# Patient Record
Sex: Female | Born: 1980 | Race: White | Hispanic: No | Smoking: Former smoker
Health system: Southern US, Community
[De-identification: ages and names within clinical notes are randomized; demographics above are authoritative.]

## PROBLEM LIST (undated history)

## (undated) DIAGNOSIS — G43909 Migraine, unspecified, not intractable, without status migrainosus: Secondary | ICD-10-CM

## (undated) DIAGNOSIS — K219 Gastro-esophageal reflux disease without esophagitis: Secondary | ICD-10-CM

## (undated) HISTORY — DX: Gastro-esophageal reflux disease without esophagitis: K21.9

## (undated) HISTORY — PX: TUBAL LIGATION: SHX77

## (undated) HISTORY — PX: CYST EXCISION: SHX5701

---

## 2004-03-03 ENCOUNTER — Emergency Department (HOSPITAL_COMMUNITY): Admission: EM | Admit: 2004-03-03 | Discharge: 2004-03-03 | Payer: Self-pay | Admitting: Emergency Medicine

## 2004-12-02 ENCOUNTER — Emergency Department (HOSPITAL_COMMUNITY): Admission: EM | Admit: 2004-12-02 | Discharge: 2004-12-03 | Payer: Self-pay | Admitting: Emergency Medicine

## 2005-03-28 ENCOUNTER — Emergency Department (HOSPITAL_COMMUNITY): Admission: EM | Admit: 2005-03-28 | Discharge: 2005-03-28 | Payer: Self-pay | Admitting: Family Medicine

## 2005-03-31 ENCOUNTER — Emergency Department (HOSPITAL_COMMUNITY): Admission: EM | Admit: 2005-03-31 | Discharge: 2005-04-01 | Payer: Self-pay | Admitting: Emergency Medicine

## 2006-01-27 ENCOUNTER — Emergency Department (HOSPITAL_COMMUNITY): Admission: EM | Admit: 2006-01-27 | Discharge: 2006-01-27 | Payer: Self-pay | Admitting: Emergency Medicine

## 2006-02-20 ENCOUNTER — Emergency Department (HOSPITAL_COMMUNITY): Admission: EM | Admit: 2006-02-20 | Discharge: 2006-02-20 | Payer: Self-pay | Admitting: Emergency Medicine

## 2006-03-31 ENCOUNTER — Encounter (INDEPENDENT_AMBULATORY_CARE_PROVIDER_SITE_OTHER): Payer: Self-pay | Admitting: Specialist

## 2006-03-31 ENCOUNTER — Other Ambulatory Visit: Admission: RE | Admit: 2006-03-31 | Discharge: 2006-03-31 | Payer: Self-pay | Admitting: Unknown Physician Specialty

## 2006-04-01 ENCOUNTER — Emergency Department (HOSPITAL_COMMUNITY): Admission: EM | Admit: 2006-04-01 | Discharge: 2006-04-01 | Payer: Self-pay | Admitting: Emergency Medicine

## 2006-04-01 ENCOUNTER — Emergency Department (HOSPITAL_COMMUNITY): Admission: EM | Admit: 2006-04-01 | Discharge: 2006-04-02 | Payer: Self-pay | Admitting: Emergency Medicine

## 2007-06-07 ENCOUNTER — Emergency Department (HOSPITAL_COMMUNITY): Admission: EM | Admit: 2007-06-07 | Discharge: 2007-06-07 | Payer: Self-pay | Admitting: Emergency Medicine

## 2007-06-29 ENCOUNTER — Emergency Department (HOSPITAL_COMMUNITY): Admission: EM | Admit: 2007-06-29 | Discharge: 2007-06-29 | Payer: Self-pay | Admitting: Emergency Medicine

## 2007-07-29 ENCOUNTER — Emergency Department (HOSPITAL_COMMUNITY): Admission: EM | Admit: 2007-07-29 | Discharge: 2007-07-29 | Payer: Self-pay | Admitting: Emergency Medicine

## 2007-08-16 ENCOUNTER — Emergency Department (HOSPITAL_COMMUNITY): Admission: EM | Admit: 2007-08-16 | Discharge: 2007-08-16 | Payer: Self-pay | Admitting: Emergency Medicine

## 2007-11-03 ENCOUNTER — Other Ambulatory Visit: Admission: RE | Admit: 2007-11-03 | Discharge: 2007-11-03 | Payer: Self-pay | Admitting: Obstetrics and Gynecology

## 2008-04-16 ENCOUNTER — Observation Stay (HOSPITAL_COMMUNITY): Admission: AD | Admit: 2008-04-16 | Discharge: 2008-04-16 | Payer: Self-pay | Admitting: Obstetrics & Gynecology

## 2008-04-16 ENCOUNTER — Ambulatory Visit: Payer: Self-pay | Admitting: Obstetrics and Gynecology

## 2008-04-18 ENCOUNTER — Inpatient Hospital Stay (HOSPITAL_COMMUNITY): Admission: AD | Admit: 2008-04-18 | Discharge: 2008-04-18 | Payer: Self-pay | Admitting: Obstetrics & Gynecology

## 2008-04-18 ENCOUNTER — Ambulatory Visit: Payer: Self-pay | Admitting: Advanced Practice Midwife

## 2008-05-06 ENCOUNTER — Inpatient Hospital Stay (HOSPITAL_COMMUNITY): Admission: AD | Admit: 2008-05-06 | Discharge: 2008-05-06 | Payer: Self-pay | Admitting: Obstetrics & Gynecology

## 2008-05-11 ENCOUNTER — Ambulatory Visit: Payer: Self-pay | Admitting: Family Medicine

## 2008-05-11 ENCOUNTER — Inpatient Hospital Stay (HOSPITAL_COMMUNITY): Admission: AD | Admit: 2008-05-11 | Discharge: 2008-05-13 | Payer: Self-pay | Admitting: Obstetrics & Gynecology

## 2008-08-29 ENCOUNTER — Emergency Department (HOSPITAL_COMMUNITY): Admission: EM | Admit: 2008-08-29 | Discharge: 2008-08-29 | Payer: Self-pay | Admitting: Emergency Medicine

## 2008-08-30 ENCOUNTER — Emergency Department (HOSPITAL_COMMUNITY): Admission: EM | Admit: 2008-08-30 | Discharge: 2008-08-30 | Payer: Self-pay | Admitting: Emergency Medicine

## 2009-03-26 ENCOUNTER — Emergency Department (HOSPITAL_COMMUNITY): Admission: EM | Admit: 2009-03-26 | Discharge: 2009-03-26 | Payer: Self-pay | Admitting: Emergency Medicine

## 2009-04-12 ENCOUNTER — Emergency Department (HOSPITAL_COMMUNITY): Admission: EM | Admit: 2009-04-12 | Discharge: 2009-04-12 | Payer: Self-pay | Admitting: Emergency Medicine

## 2009-09-22 ENCOUNTER — Emergency Department (HOSPITAL_COMMUNITY): Admission: EM | Admit: 2009-09-22 | Discharge: 2009-09-22 | Payer: Self-pay | Admitting: Emergency Medicine

## 2009-12-17 ENCOUNTER — Other Ambulatory Visit: Admission: RE | Admit: 2009-12-17 | Discharge: 2009-12-17 | Payer: Self-pay | Admitting: Obstetrics and Gynecology

## 2010-02-22 ENCOUNTER — Emergency Department (HOSPITAL_COMMUNITY): Admission: EM | Admit: 2010-02-22 | Discharge: 2010-02-22 | Payer: Self-pay | Admitting: Emergency Medicine

## 2010-04-29 ENCOUNTER — Inpatient Hospital Stay (HOSPITAL_COMMUNITY)
Admission: AD | Admit: 2010-04-29 | Discharge: 2010-05-02 | Payer: Self-pay | Source: Home / Self Care | Admitting: Obstetrics & Gynecology

## 2010-06-24 NOTE — Op Note (Addendum)
Kirsten Myers, Kirsten Myers           ACCOUNT NO.:  [id number]  MEDICAL RECORD NO.:  [id number]          PATIENT TYPE:  INP  LOCATION:  9371                          FACILITY:  [facility]  PHYSICIAN:  Lucina Mellow, DO   DATE OF BIRTH:  05/01/1981  DATE OF PROCEDURE:  04/30/2010 DATE OF DISCHARGE:                              OPERATIVE REPORT   PREOPERATIVE DIAGNOSIS:  Undesired fertility status post normal spontaneous vaginal delivery of a gravida 6, para 4-0-2-4.  POSTOPERATIVE DIAGNOSIS:  Undesired fertility status post normal spontaneous vaginal delivery of a gravida 6, para 4-0-2-4.  SURGEON:  Dr. Debroah Loop.  ASSISTANT:  Lucina Mellow, DO  ANESTHESIA:  Epidural.  PROCEDURE:  Bilateral tubal ligation with Filshie clips.  COMPLICATIONS:  None.  ESTIMATED BLOOD LOSS:  Less than 20 mL.  INDICATION:  A 30 year old female gravida 6, para 4-0-2-4 who was induced for preeclampsia and subsequently had a normal vaginal delivery of a viable infant female.  The patient desired permanent sterilization. Risks and benefits of the procedure were discussed with the patient including the risk of failure and increased risk of ectopic gestation and pregnancy were to occur.  FINDINGS:  Included a normal uterus, tubes, and ovaries.  PROCEDURE IN DETAIL:  The patient was taken to the operating room, where her epidural was found to be adequate.  A small transverse infraumbilical skin incision was then made with the scalpel after 5 mL of 0.5% Marcaine were injected subcutaneously.  Incision was carried through the underlying fascia into the peritoneum, was identified, and entered.  The peritoneum was noted to be free of any adhesions and the incision was then extended with the Metzenbaum scissors.  The patient's left fallopian tube was identified, brought to the incision, and grasped with a Babcock clamp.  The tube was followed up to the fimbria.  The Babcock clamp was then used to grasp the  tube approximately two-thirds right from the corneal region.  A Filshie clip was applied in normal fashion.  The right fallopian tube was then identified in the same fashion, brought to the incision, grasped with the Babcock clamp, followed up with the fimbriae and then was back to about two-third away from the corneal region.  Again, a Filshie clip was placed in the normal fashion.  Excellent hemostasis was noted and then tubes were both returned to the abdomen.  The peritoneum and fascia were closed with single layer of 0 Vicryl.  The skin was closed with the subcuticular fashion using a 2-0 Vicryl.  Three interrupted 4-0 Vicryl incisions were made to approximately to skin.  Sponge, lap, and needle counts were correct x2.  The patient tolerated the procedure well and the patient was taken to the recovery room in stable condition.  Medications given during the procedure included the continued magnesium sulfate at 2 g an hour at which she will also continue postpartum in recovery room and then finally whenever she is transferred to postpartum floor for 24 total hours.  COMPLICATIONS OF THE PROCEDURE:  None.          ______________________________ Lucina Mellow, DO     SH/MEDQ  D:  04/30/2010  T:  04/30/2010  Job:  [id number]  Electronically Signed by Lucina Mellow MD on 05/03/2010 08:55:06 AM Electronically Signed by Scheryl Darter MD on 06/24/2010 11:42:24 AM

## 2010-08-13 LAB — RPR: RPR Ser Ql: NONREACTIVE

## 2010-08-13 LAB — COMPREHENSIVE METABOLIC PANEL
ALT: 10 U/L (ref 0–35)
AST: 19 U/L (ref 0–37)
Albumin: 2.6 g/dL — ABNORMAL LOW (ref 3.5–5.2)
Calcium: 9.4 mg/dL (ref 8.4–10.5)
Creatinine, Ser: 0.73 mg/dL (ref 0.4–1.2)
GFR calc Af Amer: >60 mL/min (ref >60)
Sodium: 135 mEq/L (ref 135–145)
Total Protein: 5.5 g/dL — ABNORMAL LOW (ref 6.0–8.3)

## 2010-08-13 LAB — GLUCOSE, CAPILLARY
Glucose-Capillary: 140 mg/dL — ABNORMAL HIGH (ref 70–99)
Glucose-Capillary: 79 mg/dL (ref 70–99)
Glucose-Capillary: 93 mg/dL (ref 70–99)
Glucose-Capillary: 94 mg/dL (ref 70–99)
Glucose-Capillary: 96 mg/dL (ref 70–99)

## 2010-08-13 LAB — CBC
HCT: 35.1 % — ABNORMAL LOW (ref 36.0–46.0)
HCT: 36.1 % (ref 36.0–46.0)
Hemoglobin: 11.9 g/dL — ABNORMAL LOW (ref 12.0–15.0)
Platelets: 190 10*3/uL (ref 150–400)
RDW: 14.4 % (ref 11.5–15.5)
RDW: 14.8 % (ref 11.5–15.5)
WBC: 10.4 10*3/uL (ref 4.0–10.5)
WBC: 12.7 10*3/uL — ABNORMAL HIGH (ref 4.0–10.5)

## 2010-08-13 LAB — MRSA PCR SCREENING: MRSA by PCR: NEGATIVE

## 2010-08-13 LAB — PROTEIN / CREATININE RATIO, URINE: Protein Creatinine Ratio: 0.53 — ABNORMAL HIGH (ref 0.00–0.15)

## 2010-08-13 LAB — PROTEIN, URINE, RANDOM: Total Protein, Urine: 62 mg/dL

## 2010-08-15 LAB — CBC
HCT: 34.2 % — ABNORMAL LOW (ref 36.0–46.0)
MCH: 31 pg (ref 26.0–34.0)
MCHC: 34.9 g/dL (ref 30.0–36.0)
MCV: 88.8 fL (ref 78.0–100.0)
Platelets: 202 10*3/uL (ref 150–400)
RDW: 13.4 % (ref 11.5–15.5)
WBC: 11.8 10*3/uL — ABNORMAL HIGH (ref 4.0–10.5)

## 2010-08-15 LAB — WET PREP, GENITAL
Trich, Wet Prep: NONE SEEN
Yeast Wet Prep HPF POC: NONE SEEN

## 2010-08-15 LAB — DIFFERENTIAL
Basophils Relative: 0 % (ref 0–1)
Lymphocytes Relative: 14 % (ref 12–46)
Lymphs Abs: 1.7 10*3/uL (ref 0.7–4.0)
Monocytes Absolute: 0.6 10*3/uL (ref 0.1–1.0)
Monocytes Relative: 5 % (ref 3–12)
Neutro Abs: 9.5 10*3/uL — ABNORMAL HIGH (ref 1.7–7.7)
Neutrophils Relative %: 80 % — ABNORMAL HIGH (ref 43–77)

## 2010-08-15 LAB — URINALYSIS, ROUTINE W REFLEX MICROSCOPIC
Bilirubin Urine: NEGATIVE
Ketones, ur: NEGATIVE mg/dL
Nitrite: NEGATIVE
Specific Gravity, Urine: 1.02 (ref 1.005–1.030)
pH: 6.5 (ref 5.0–8.0)

## 2010-08-15 LAB — COMPREHENSIVE METABOLIC PANEL
Albumin: 2.8 g/dL — ABNORMAL LOW (ref 3.5–5.2)
Alkaline Phosphatase: 83 U/L (ref 39–117)
BUN: 4 mg/dL — ABNORMAL LOW (ref 6–23)
Calcium: 8.3 mg/dL — ABNORMAL LOW (ref 8.4–10.5)
Creatinine, Ser: 0.57 mg/dL (ref 0.4–1.2)
Glucose, Bld: 88 mg/dL (ref 70–99)
Total Protein: 6 g/dL (ref 6.0–8.3)

## 2010-08-15 LAB — GC/CHLAMYDIA PROBE AMP, GENITAL: Chlamydia, DNA Probe: NEGATIVE

## 2010-08-15 LAB — URINE CULTURE
Colony Count: NO GROWTH
Culture  Setup Time: 201109240040

## 2010-08-15 LAB — URINE MICROSCOPIC-ADD ON

## 2010-08-20 LAB — URINALYSIS, ROUTINE W REFLEX MICROSCOPIC
Bilirubin Urine: NEGATIVE
Hgb urine dipstick: NEGATIVE
Ketones, ur: NEGATIVE mg/dL
Specific Gravity, Urine: >1.03 — ABNORMAL HIGH (ref 1.005–1.030)
Urobilinogen, UA: 0.2 mg/dL (ref 0.0–1.0)
pH: 6 (ref 5.0–8.0)

## 2010-09-05 LAB — URINALYSIS, ROUTINE W REFLEX MICROSCOPIC
Hgb urine dipstick: NEGATIVE
Protein, ur: NEGATIVE mg/dL
Urobilinogen, UA: 0.2 mg/dL (ref 0.0–1.0)

## 2010-09-05 LAB — URINE MICROSCOPIC-ADD ON

## 2010-09-12 LAB — URINALYSIS, ROUTINE W REFLEX MICROSCOPIC
Hgb urine dipstick: NEGATIVE
Nitrite: NEGATIVE
Protein, ur: NEGATIVE mg/dL
Specific Gravity, Urine: 1.015 (ref 1.005–1.030)
Urobilinogen, UA: 0.2 mg/dL (ref 0.0–1.0)

## 2010-09-12 LAB — RAPID STREP SCREEN (MED CTR MEBANE ONLY): Streptococcus, Group A Screen (Direct): POSITIVE — AB

## 2010-10-15 NOTE — Discharge Summary (Signed)
Kirsten Myers, Kirsten Myers           ACCOUNT NO.:  [id number]   MEDICAL RECORD NO.:  [id number]          PATIENT TYPE:  INP   LOCATION:  9164                          FACILITY:  [facility]   PHYSICIAN:  Kirsten Myers, M.D.   DATE OF BIRTH:  11/06/1980   DATE OF ADMISSION:  04/18/2008  DATE OF DISCHARGE:  04/18/2008                               DISCHARGE SUMMARY   HISTORY OF PRESENT ILLNESS:  A 30 year old gravida 2, para 1 at 35 weeks  and 5 days gestation.  She was seen in the office at [facility] today  and diagnosed with polyhydramnios.  At that time, she was having  contractions on the monitor that she was not interpreting as too  painful.  She showed up to Maternity Admissions, seen at [facility] this evening around 6 o'clock, complaining of labor  contractions.  She states that in the office today her cervix was  checked, and she was 3-4 thick and high.   Upon arrival here, she was noted to be 4-5 cm thick and high and  contracting every 2-3 minutes to what she interpreted as strong.  It was  difficult to assess the strength of her contractions due to her size and  her polyhydramnios.  Fetal heart rate has been reactive without these  cells.  She walked, and we observed her for a total of 4 hours.  She has  not progressed at all as far as cervical dilation.  She got 6 mg of  morphine IV, which did slow some of her contractions down.  Because she  does not seem to be making any cervical change, we are going to  discharge her home with instructions to return if the contractions  change in frequency or strength or if she has ruptured changes.      Kirsten Myers, C.N.M.      ______________________________  Kirsten Myers, M.D.    FC/MEDQ  D:  04/18/2008  T:  04/19/2008  Job:  [id number]   cc:   [facility]

## 2011-02-21 LAB — URINALYSIS, ROUTINE W REFLEX MICROSCOPIC
Bilirubin Urine: NEGATIVE
Ketones, ur: NEGATIVE
Nitrite: NEGATIVE
Protein, ur: NEGATIVE
Urobilinogen, UA: 0.2
pH: 6

## 2011-02-21 LAB — PREGNANCY, URINE: Preg Test, Ur: NEGATIVE

## 2011-03-05 LAB — CBC
Hemoglobin: 11.5 — ABNORMAL LOW
MCHC: 34.2
Platelets: 231
RDW: 13.9

## 2011-03-05 LAB — RPR: RPR Ser Ql: NONREACTIVE

## 2011-03-07 LAB — RPR: RPR Ser Ql: NONREACTIVE

## 2011-03-07 LAB — GLUCOSE, CAPILLARY: Glucose-Capillary: 92 mg/dL (ref 70–99)

## 2011-03-07 LAB — CBC
MCHC: 34.3 g/dL (ref 30.0–36.0)
MCV: 88.6 fL (ref 78.0–100.0)
RBC: 3.93 MIL/uL (ref 3.87–5.11)
RDW: 15.1 % (ref 11.5–15.5)

## 2011-12-03 ENCOUNTER — Emergency Department (HOSPITAL_COMMUNITY)
Admission: EM | Admit: 2011-12-03 | Discharge: 2011-12-03 | Disposition: A | Discharge status: Home / Self Care | Payer: Medicaid Other | Attending: Emergency Medicine | Admitting: Emergency Medicine

## 2011-12-03 ENCOUNTER — Encounter (HOSPITAL_COMMUNITY): Payer: Self-pay | Admitting: *Deleted

## 2011-12-03 ENCOUNTER — Emergency Department (HOSPITAL_COMMUNITY): Payer: Medicaid Other

## 2011-12-03 DIAGNOSIS — F17200 Nicotine dependence, unspecified, uncomplicated: Secondary | ICD-10-CM | POA: Insufficient documentation

## 2011-12-03 DIAGNOSIS — N390 Urinary tract infection, site not specified: Secondary | ICD-10-CM

## 2011-12-03 DIAGNOSIS — R109 Unspecified abdominal pain: Secondary | ICD-10-CM | POA: Insufficient documentation

## 2011-12-03 LAB — CBC WITH DIFFERENTIAL/PLATELET
Basophils Absolute: 0 10*3/uL (ref 0.0–0.1)
Basophils Relative: 0 % (ref 0–1)
Eosinophils Relative: 2 % (ref 0–5)
HCT: 40.8 % (ref 36.0–46.0)
Lymphocytes Relative: 22 % (ref 12–46)
MCHC: 33.8 g/dL (ref 30.0–36.0)
Monocytes Absolute: 0.5 10*3/uL (ref 0.1–1.0)
Neutro Abs: 6.8 10*3/uL (ref 1.7–7.7)
Platelets: 219 10*3/uL (ref 150–400)
RDW: 13.3 % (ref 11.5–15.5)
WBC: 9.6 10*3/uL (ref 4.0–10.5)

## 2011-12-03 LAB — COMPREHENSIVE METABOLIC PANEL
ALT: 14 U/L (ref 0–35)
AST: 15 U/L (ref 0–37)
Albumin: 3.9 g/dL (ref 3.5–5.2)
Calcium: 9.3 mg/dL (ref 8.4–10.5)
Chloride: 101 mEq/L (ref 96–112)
Creatinine, Ser: 0.65 mg/dL (ref 0.50–1.10)
Sodium: 137 mEq/L (ref 135–145)

## 2011-12-03 LAB — URINALYSIS, ROUTINE W REFLEX MICROSCOPIC
Bilirubin Urine: NEGATIVE
Glucose, UA: NEGATIVE mg/dL
Specific Gravity, Urine: >1.03 — ABNORMAL HIGH (ref 1.005–1.030)
pH: 6 (ref 5.0–8.0)

## 2011-12-03 LAB — PREGNANCY, URINE: Preg Test, Ur: NEGATIVE

## 2011-12-03 LAB — URINE MICROSCOPIC-ADD ON

## 2011-12-03 MED ORDER — IOHEXOL 300 MG/ML  SOLN
100.0000 mL | Freq: Once | INTRAMUSCULAR | Status: AC | PRN
  Administered 2011-12-03: 100 mL via INTRAVENOUS

## 2011-12-03 MED ORDER — HYDROCODONE-ACETAMINOPHEN 5-325 MG PO TABS: 1.0000 | ORAL_TABLET | Freq: Four times a day (QID) | ORAL | Status: AC | PRN

## 2011-12-03 MED ORDER — HYDROMORPHONE HCL PF 1 MG/ML IJ SOLN
1.0000 mg | Freq: Once | INTRAMUSCULAR | Status: AC
  Administered 2011-12-03: 1 mg via INTRAVENOUS
  Filled 2011-12-03: qty 1

## 2011-12-03 MED ORDER — DEXTROSE 5 % IV SOLN
1.0000 g | Freq: Once | INTRAVENOUS | Status: AC
  Administered 2011-12-03: 1 g via INTRAVENOUS
  Filled 2011-12-03: qty 10

## 2011-12-03 MED ORDER — SODIUM CHLORIDE 0.9 % IV SOLN
Freq: Once | INTRAVENOUS | Status: AC
  Administered 2011-12-03: 20:00:00 via INTRAVENOUS

## 2011-12-03 MED ORDER — ONDANSETRON HCL 4 MG/2ML IJ SOLN
4.0000 mg | Freq: Once | INTRAMUSCULAR | Status: AC
  Administered 2011-12-03: 4 mg via INTRAVENOUS
  Filled 2011-12-03: qty 2

## 2011-12-03 MED ORDER — CEPHALEXIN 500 MG PO CAPS: 500.0000 mg | ORAL_CAPSULE | Freq: Four times a day (QID) | ORAL | Status: AC

## 2011-12-03 NOTE — ED Notes (Signed)
Pain 6\10 at this time. Tolerating contrast well. Denies needs. No distress. Call bell within reach.

## 2011-12-03 NOTE — ED Notes (Signed)
MD at bedside to discuss plan of care

## 2011-12-03 NOTE — ED Provider Notes (Signed)
History     CSN: [id number]  Arrival date & time 12/03/11  1912   First MD Initiated Contact with Patient 12/03/11 1953      Chief Complaint  Patient presents with  . Abdominal Pain    (Consider location/radiation/quality/duration/timing/severity/associated sxs/prior treatment) Patient is a 31 y.o. female presenting with abdominal pain. The history is provided by the patient (the pt complains of abd pain).  Abdominal Pain The primary symptoms of the illness include abdominal pain. The primary symptoms of the illness do not include fever, fatigue or diarrhea. The current episode started 2 days ago. The onset of the illness was gradual. The problem has not changed since onset. Associated with: none. The patient states that she believes she is currently not pregnant. The patient has not had a change in bowel habit. Risk factors: nothing. Symptoms associated with the illness do not include chills, hematuria, frequency or back pain. Significant associated medical issues include diverticulitis.    History reviewed. No pertinent past medical history.  Past Surgical History  Procedure Date  . Tubal ligation     History reviewed. No pertinent family history.  History  Substance Use Topics  . Smoking status: Current Everyday Smoker  . Smokeless tobacco: Not on file  . Alcohol Use: No    OB History    Grav Para Term Preterm Abortions TAB SAB Ect Mult Living                  Review of Systems  Constitutional: Negative for fever, chills and fatigue.  HENT: Negative for congestion, sinus pressure and ear discharge.   Eyes: Negative for discharge.  Respiratory: Negative for cough.   Cardiovascular: Negative for chest pain.  Gastrointestinal: Positive for abdominal pain. Negative for diarrhea.  Genitourinary: Negative for frequency and hematuria.  Musculoskeletal: Negative for back pain.  Skin: Negative for rash.  Neurological: Negative for seizures and headaches.  Hematological:  Negative.   Psychiatric/Behavioral: Negative for hallucinations.    Allergies  Aspirin  Home Medications   Current Outpatient Rx  Name Route Sig Dispense Refill  . CLONAZEPAM 0.5 MG PO TABS Oral Take 0.5 mg by mouth 3 (three) times daily as needed. For anxiety    . CEPHALEXIN 500 MG PO CAPS Oral Take 1 capsule (500 mg total) by mouth 4 (four) times daily. 28 capsule 0  . HYDROCODONE-ACETAMINOPHEN 5-325 MG PO TABS Oral Take 1 tablet by mouth every 6 (six) hours as needed for pain. 30 tablet 0    BP 123/79  Pulse 73  Temp 98.5 F (36.9 C) (Oral)  Resp 18  Ht 5\' 8"  (1.727 m)  Wt 250 lb (113.399 kg)  BMI 38.01 kg/m2  SpO2 98%  LMP 11/26/2011  Physical Exam  Constitutional: She is oriented to person, place, and time. She appears well-developed.  HENT:  Head: Normocephalic and atraumatic.  Eyes: Conjunctivae and EOM are normal. No scleral icterus.  Neck: Neck supple. No thyromegaly present.  Cardiovascular: Normal rate and regular rhythm.  Exam reveals no gallop and no friction rub.   No murmur heard. Pulmonary/Chest: No stridor. She has no wheezes. She has no rales. She exhibits no tenderness.  Abdominal: She exhibits no distension. There is tenderness. There is no rebound.       Tender rlq  Musculoskeletal: Normal range of motion. She exhibits no edema.  Lymphadenopathy:    She has no cervical adenopathy.  Neurological: She is oriented to person, place, and time. Coordination normal.  Skin: No rash  noted. No erythema.  Psychiatric: She has a normal mood and affect. Her behavior is normal.    ED Course  Procedures (including critical care time)  Labs Reviewed  COMPREHENSIVE METABOLIC PANEL - Abnormal; Notable for the following:    Glucose, Bld 125 (*)     All other components within normal limits  URINALYSIS, ROUTINE W REFLEX MICROSCOPIC - Abnormal; Notable for the following:    APPearance HAZY (*)     Specific Gravity, Urine >1.030 (*)     Hgb urine dipstick SMALL  (*)     Protein, ur TRACE (*)     Leukocytes, UA SMALL (*)     All other components within normal limits  URINE MICROSCOPIC-ADD ON - Abnormal; Notable for the following:    Squamous Epithelial / LPF FEW (*)     Bacteria, UA FEW (*)     All other components within normal limits  CBC WITH DIFFERENTIAL  PREGNANCY, URINE  URINE CULTURE   Ct Abdomen Pelvis W Contrast  12/03/2011  *RADIOLOGY REPORT*  Clinical Data: Abdominal and pelvic pain.  CT ABDOMEN AND PELVIS WITH CONTRAST  Technique:  Multidetector CT imaging of the abdomen and pelvis was performed following the standard protocol during bolus administration of intravenous contrast.  Contrast: OMNIPAQUE IOHEXOL 300 MG/ML  SOLN  Comparison:  Ultrasound 12/19/2010  Findings: Lung bases are clear.  No effusions.  Heart is normal size.  Liver, gallbladder, spleen, pancreas, adrenals and kidneys are normal.  Appendix is visualized and is normal.  Uterus, adnexa and urinary bladder grossly unremarkable.  Bilateral tubal ligation clips noted. Bowel grossly unremarkable.  No free fluid, free air, or adenopathy.Trace free fluid in the pelvis.  Aorta is normal caliber.  No acute bony abnormality.  IMPRESSION: Unremarkable study.  Original Report Authenticated By: Cyndie Chime, M.D.     1. UTI (lower urinary tract infection)       MDM          Benny Lennert, MD 12/03/11 2154

## 2011-12-03 NOTE — ED Notes (Signed)
MD at bedside to evaluate.

## 2011-12-03 NOTE — ED Notes (Signed)
Abd pain,No NVD.  No fever. NO urinary sx

## 2011-12-03 NOTE — ED Notes (Signed)
Medicated as ordered. Pain 6\10. Denies needs. No distress. Call bell within reach.

## 2011-12-03 NOTE — ED Notes (Signed)
Pain 7\10. Resting comfortably. Call bell within reach. Bed in low position and locked with side rails up. Will continue to monitor.

## 2011-12-03 NOTE — ED Notes (Signed)
Into room to assess. Patient sitting up in bed. Pain 8\10. States pain started at 0500 this morning. Denies nausea vomiting. Sharp, constant pain in lower right and lower middle abdomen. States pain gets worse at times. Active bowel sounds in all fields. Tender in right upper and lower abdomen. Soft abdomen. Last BM this morning and was normal. LMP on 11/26/11. Last food intake yesterday. Has been sipping on fluids throughout the day. Call bell within reach. Will continue to monitor.

## 2011-12-03 NOTE — ED Notes (Signed)
Patient back to room from radiology. No distress. Denies needs. Pain 7\10. Call bell within reach. MD aware.

## 2011-12-03 NOTE — ED Notes (Signed)
Patient to radiology via stretcher

## 2011-12-05 LAB — URINE CULTURE: Colony Count: >=100000

## 2012-10-26 ENCOUNTER — Emergency Department (HOSPITAL_COMMUNITY)
Admission: EM | Admit: 2012-10-26 | Discharge: 2012-10-26 | Disposition: A | Discharge status: Home / Self Care | Payer: Medicaid Other | Attending: Emergency Medicine | Admitting: Emergency Medicine

## 2012-10-26 ENCOUNTER — Encounter (HOSPITAL_COMMUNITY): Payer: Self-pay | Admitting: *Deleted

## 2012-10-26 DIAGNOSIS — Z8679 Personal history of other diseases of the circulatory system: Secondary | ICD-10-CM | POA: Insufficient documentation

## 2012-10-26 DIAGNOSIS — Z79899 Other long term (current) drug therapy: Secondary | ICD-10-CM | POA: Insufficient documentation

## 2012-10-26 DIAGNOSIS — Z888 Allergy status to other drugs, medicaments and biological substances status: Secondary | ICD-10-CM | POA: Insufficient documentation

## 2012-10-26 DIAGNOSIS — R110 Nausea: Secondary | ICD-10-CM | POA: Insufficient documentation

## 2012-10-26 DIAGNOSIS — R519 Headache, unspecified: Secondary | ICD-10-CM

## 2012-10-26 DIAGNOSIS — F17200 Nicotine dependence, unspecified, uncomplicated: Secondary | ICD-10-CM | POA: Insufficient documentation

## 2012-10-26 DIAGNOSIS — H53149 Visual discomfort, unspecified: Secondary | ICD-10-CM | POA: Insufficient documentation

## 2012-10-26 DIAGNOSIS — R51 Headache: Secondary | ICD-10-CM | POA: Insufficient documentation

## 2012-10-26 HISTORY — DX: Migraine, unspecified, not intractable, without status migrainosus: G43.909

## 2012-10-26 MED ORDER — SODIUM CHLORIDE 0.9 % IV BOLUS (SEPSIS)
1000.0000 mL | Freq: Once | INTRAVENOUS | Status: AC
  Administered 2012-10-26: 1000 mL via INTRAVENOUS

## 2012-10-26 MED ORDER — DIPHENHYDRAMINE HCL 50 MG/ML IJ SOLN
25.0000 mg | Freq: Once | INTRAMUSCULAR | Status: AC
  Administered 2012-10-26: 25 mg via INTRAVENOUS
  Filled 2012-10-26: qty 1

## 2012-10-26 MED ORDER — KETOROLAC TROMETHAMINE 30 MG/ML IJ SOLN
30.0000 mg | Freq: Once | INTRAMUSCULAR | Status: AC
  Administered 2012-10-26: 30 mg via INTRAVENOUS
  Filled 2012-10-26: qty 1

## 2012-10-26 MED ORDER — METOCLOPRAMIDE HCL 5 MG/ML IJ SOLN
10.0000 mg | Freq: Once | INTRAMUSCULAR | Status: AC
  Administered 2012-10-26: 10 mg via INTRAVENOUS
  Filled 2012-10-26: qty 2

## 2012-10-26 MED ORDER — ONDANSETRON HCL 4 MG/2ML IJ SOLN
4.0000 mg | Freq: Once | INTRAMUSCULAR | Status: AC
  Administered 2012-10-26: 4 mg via INTRAVENOUS
  Filled 2012-10-26: qty 2

## 2012-10-26 NOTE — ED Notes (Signed)
Pt. States pain is decreased but still hurting especially in neck.

## 2012-10-26 NOTE — ED Notes (Signed)
Pt has had a headache for 3 days.  She was seen at [facility] last night and was given imitrex.  She said that didn't really help or make it feel better.  Pt also says she has pain in the back of her neck that goes up her head.  Pt has some photophobia.  No vomiting but is having nausea. No appetite.  She got a script for a headache med but hasn't taken it.  No other meds at home.

## 2012-10-26 NOTE — ED Notes (Signed)
Pt. C/o HA x3 days. States yesterday began having "a crick in my neck that shoot ups to my head. It is worse when I turn my head to the left". States went to hospital and got a shot of Imitrex but HA came right back. Pt. Alert and oriented x4.

## 2012-10-26 NOTE — ED Provider Notes (Signed)
History     CSN: [id number]  Arrival date & time 10/26/12  1645   First MD Initiated Contact with Patient 10/26/12 1950      Chief Complaint  Patient presents with  . Headache    (Consider location/radiation/quality/duration/timing/severity/associated sxs/prior treatment) HPI Comments: Patient presents with a three-day history of headache that is gradual onset getting worse. Is a history of migraines but feels this pain is more severe. Denies thunderclap onset. He was seen at [facility] last night given Imitrex with partial relief. Denies any visual changes. Endorses photophobia and nausea. No vomiting or fever. No blurry vision or double vision. No rashes, chest pain or shortness of breath. Taking Tylenol at home without relief.  The history is provided by the patient.    Past Medical History  Diagnosis Date  . Migraine     Past Surgical History  Procedure Laterality Date  . Tubal ligation    . Cyst excision      No family history on file.  History  Substance Use Topics  . Smoking status: Current Every Day Smoker  . Smokeless tobacco: Not on file  . Alcohol Use: No    OB History   Grav Para Term Preterm Abortions TAB SAB Ect Mult Living                  Review of Systems  Constitutional: Negative for fever, activity change and appetite change.  HENT: Negative for congestion and rhinorrhea.   Eyes: Positive for photophobia. Negative for pain.  Respiratory: Negative for cough, chest tightness and shortness of breath.   Cardiovascular: Negative for chest pain.  Gastrointestinal: Positive for nausea. Negative for abdominal pain.  Genitourinary: Negative for dysuria, hematuria, vaginal bleeding and vaginal discharge.  Musculoskeletal: Negative for back pain.  Skin: Negative for rash.  Neurological: Positive for headaches. Negative for weakness.  A complete 10 system review of systems was obtained and all systems are negative except as noted in the HPI and PMH.     Allergies  Aspirin  Home Medications   Current Outpatient Rx  Name  Route  Sig  Dispense  Refill  . rizatriptan (MAXALT-MLT) 10 MG disintegrating tablet   Oral   Take 10 mg by mouth as needed for migraine. May repeat in 2 hours if needed           BP 132/91  Pulse 72  Temp(Src) 98 F (36.7 C) (Oral)  Resp 16  Wt 274 lb 6.4 oz (124.467 kg)  BMI 41.73 kg/m2  SpO2 97%  Physical Exam  Constitutional: She is oriented to person, place, and time. She appears well-developed and well-nourished. No distress.  HENT:  Head: Normocephalic and atraumatic.  Mouth/Throat: Oropharynx is clear and moist. No oropharyngeal exudate.  Eyes: Conjunctivae and EOM are normal. Pupils are equal, round, and reactive to light.  No appreciable papilledema  Neck: Normal range of motion. Neck supple.  No meningismus  Cardiovascular: Normal rate, regular rhythm and normal heart sounds.   No murmur heard. Pulmonary/Chest: Effort normal and breath sounds normal. No respiratory distress.  Abdominal: Soft. There is no tenderness. There is no rebound and no guarding.  Musculoskeletal: Normal range of motion. She exhibits no edema and no tenderness.  Neurological: She is alert and oriented to person, place, and time. No cranial nerve deficit. She exhibits normal muscle tone. Coordination normal.  CN 2-12 intact, no ataxia on finger to nose, no nystagmus, 5/5 strength throughout, no pronator drift, Romberg negative, normal gait.  Skin: Skin is warm.    ED Course  Procedures (including critical care time)  Labs Reviewed - No data to display No results found.   No diagnosis found.    MDM  Three-day history of gradual onset headache similar to previous. No thunderclap onset. No visual changes. No fevers or vomiting. No focal weakness, numbness or tingling. Pseudotumor is considered given patient's body habitus, no visible papilledema. No visual changes.  No focal neurological deficits or red  flags.  Patient received IV fluids, Toradol, Zofran, Reglan and Benadryl. She had good relief of her headache. She is not given narcotics as she is driving. She'll followup with neurology.     Glynn Octave, MD 10/27/12 [phone]

## 2012-10-28 ENCOUNTER — Emergency Department (HOSPITAL_COMMUNITY)
Admission: EM | Admit: 2012-10-28 | Discharge: 2012-10-28 | Disposition: A | Discharge status: Home / Self Care | Payer: Medicaid Other | Attending: Emergency Medicine | Admitting: Emergency Medicine

## 2012-10-28 ENCOUNTER — Emergency Department (HOSPITAL_COMMUNITY): Payer: Medicaid Other

## 2012-10-28 ENCOUNTER — Encounter (HOSPITAL_COMMUNITY): Payer: Self-pay | Admitting: Emergency Medicine

## 2012-10-28 DIAGNOSIS — S139XXA Sprain of joints and ligaments of unspecified parts of neck, initial encounter: Secondary | ICD-10-CM | POA: Insufficient documentation

## 2012-10-28 DIAGNOSIS — F17200 Nicotine dependence, unspecified, uncomplicated: Secondary | ICD-10-CM | POA: Insufficient documentation

## 2012-10-28 DIAGNOSIS — S161XXD Strain of muscle, fascia and tendon at neck level, subsequent encounter: Secondary | ICD-10-CM

## 2012-10-28 DIAGNOSIS — Y999 Unspecified external cause status: Secondary | ICD-10-CM | POA: Insufficient documentation

## 2012-10-28 DIAGNOSIS — X58XXXA Exposure to other specified factors, initial encounter: Secondary | ICD-10-CM | POA: Insufficient documentation

## 2012-10-28 DIAGNOSIS — Z79899 Other long term (current) drug therapy: Secondary | ICD-10-CM | POA: Insufficient documentation

## 2012-10-28 DIAGNOSIS — Y929 Unspecified place or not applicable: Secondary | ICD-10-CM | POA: Insufficient documentation

## 2012-10-28 DIAGNOSIS — Z8679 Personal history of other diseases of the circulatory system: Secondary | ICD-10-CM | POA: Insufficient documentation

## 2012-10-28 DIAGNOSIS — Z888 Allergy status to other drugs, medicaments and biological substances status: Secondary | ICD-10-CM | POA: Insufficient documentation

## 2012-10-28 MED ORDER — DIAZEPAM 2 MG PO TABS: 2.0000 mg | ORAL_TABLET | Freq: Two times a day (BID) | ORAL | Status: DC

## 2012-10-28 MED ORDER — HYDROCODONE-ACETAMINOPHEN 5-325 MG PO TABS
1.0000 | ORAL_TABLET | Freq: Once | ORAL | Status: AC
  Administered 2012-10-28: 1 via ORAL
  Filled 2012-10-28: qty 1

## 2012-10-28 MED ORDER — DIAZEPAM 5 MG PO TABS
5.0000 mg | ORAL_TABLET | Freq: Once | ORAL | Status: AC
  Administered 2012-10-28: 5 mg via ORAL
  Filled 2012-10-28: qty 1

## 2012-10-28 MED ORDER — HYDROCODONE-ACETAMINOPHEN 5-325 MG PO TABS: 1.0000 | ORAL_TABLET | Freq: Once | ORAL | Status: DC

## 2012-10-28 NOTE — ED Notes (Addendum)
NURSE FIRST ROUNDS : NURSE EXPLAINED DELAY , WAIT TIME AND PROCESS , SEEN AMBULATING WITH NO DISTRESS AT THIS TIME , RESPIRATIONS UNLABORED .

## 2012-10-28 NOTE — ED Provider Notes (Signed)
History     CSN: [id number]  Arrival date & time 10/28/12  1346   First MD Initiated Contact with Patient 10/28/12 1956      Chief Complaint  Patient presents with  . Headache  . Illegal value: [    Neck stiffness.     (Consider location/radiation/quality/duration/timing/severity/associated sxs/prior treatment) HPI Comments: Patient states, that she was here on Tuesday for headache, and neck stiffness.  She was treated in the emergency room for headache, which resolved with the treatment, but was still having some neck, discomfort.  She's been taking Tylenol at home without relief of her neck pain.  She denies any trauma, recent illnesses, fever, generalized myalgias.  She does have 4 children at home.  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  She denies trauma, MVC, history of MVC, history of arthritis, denies tick bite or rash.  Her youngest child is 2 and she still carries this child around occasionally  Patient is a 32 y.o. female presenting with headaches. The history is provided by the patient.  Headache Radiates to:  R neck Severity currently:  9/10 Severity at highest:  9/10 Onset quality:  Unable to specify Duration:  4 days Timing:  Intermittent Progression:  Worsening Chronicity:  New Similar to prior headaches: no   Relieved by:  Acetaminophen Worsened by:  Neck movement Ineffective treatments:  Acetaminophen Associated symptoms: no back pain, no cough, no fever, no myalgias and no sore throat     Past Medical History  Diagnosis Date  . Migraine     Past Surgical History  Procedure Laterality Date  . Tubal ligation    . Cyst excision      History reviewed. No pertinent family history.  History  Substance Use Topics  . Smoking status: Current Every Day Smoker -- 0.50 packs/day    Types: Cigarettes  . Smokeless tobacco: Not on file  . Alcohol Use: No    OB History   Grav Para Term Preterm Abortions TAB SAB Ect Mult Living                  Review of Systems    Constitutional: Negative for fever and chills.  HENT: Negative for sore throat.   Respiratory: Negative for cough and shortness of breath.   Cardiovascular: Negative for chest pain.  Musculoskeletal: Negative for myalgias, back pain and arthralgias.  Skin: Negative for pallor, rash and wound.  Neurological: Positive for headaches. Negative for syncope and weakness.    Allergies  Aspirin  Home Medications   Current Outpatient Rx  Name  Route  Sig  Dispense  Refill  . acetaminophen (TYLENOL) 500 MG tablet   Oral   Take 1,000 mg by mouth 2 (two) times daily as needed for pain.         . rizatriptan (MAXALT-MLT) 10 MG disintegrating tablet   Oral   Take 10 mg by mouth as needed for migraine. May repeat in 2 hours if needed         . diazepam (VALIUM) 2 MG tablet   Oral   Take 1 tablet (2 mg total) by mouth 2 (two) times daily.   10 tablet   0   . HYDROcodone-acetaminophen (NORCO/VICODIN) 5-325 MG per tablet   Oral   Take 1 tablet by mouth once.   30 tablet   0     BP 124/62  Pulse 53  Temp(Src) 98 F (36.7 C) (Oral)  Resp 16  Ht 5\' 8"  (1.727 m)  Wt 250 lb (113.399 kg)  BMI 38.02 kg/m2  SpO2 100%  LMP 10/28/2012  Physical Exam  Nursing note and vitals reviewed. Constitutional: She appears well-developed and well-nourished.  HENT:  Head: Normocephalic and atraumatic.  Right Ear: External ear normal.  Left Ear: External ear normal.  Mouth/Throat: Oropharynx is clear and moist.  Eyes: Pupils are equal, round, and reactive to light.  Neck: Muscular tenderness present. No spinous process tenderness present. No rigidity. No edema and no erythema present. No Brudzinski's sign noted.    Cardiovascular: Normal rate and regular rhythm.   Pulmonary/Chest: Effort normal and breath sounds normal.  Abdominal: Soft.  Musculoskeletal: Normal range of motion.  Neurological: She is alert.  Skin: Skin is warm and dry. No rash noted. No erythema.    ED Course   Procedures (including critical care time)  Labs Reviewed - No data to display Dg Cervical Spine Complete  10/28/2012   *RADIOLOGY REPORT*  Clinical Data: Headache, migraines, neck stiffness, and pain along the right aspect of c-spine x 5 days. No known injury.  CERVICAL SPINE - COMPLETE 4+ VIEW  Comparison: None.  Findings: No prevertebral soft tissue swelling is identified.  No cervical vertebral malalignment noted.  No cervical spine fracture is evident.  IMPRESSION: No cervical spine fracture, static instability, or abnormality observed.   Original Report Authenticated By: Gaylyn Rong, M.D.     1. Cervical strain, acute, subsequent encounter       MDM   Extra reviewed.  Patient reexamined is full range of motion of her neck.  She stating she feels much, better.  She'll be sent home with prescription for Valium, and Vicodin.  She did caution to make, sure to keep this from her small children at home.  Encourage her to keep her referral appointments        Arman Filter, NP 10/29/12 0447  Arman Filter, NP 10/29/12 [phone]

## 2012-10-28 NOTE — ED Notes (Addendum)
Pt states that she was here on Tuesday because of a headache and stiff neck that started on Saturday. Pt has returned today because she reports increased stiffness in her neck, and her headache is unresolved by treatment. Pt denies fever.

## 2012-11-02 NOTE — ED Provider Notes (Signed)
Medical screening examination/treatment/procedure(s) were performed by non-physician practitioner and as supervising physician I was immediately available for consultation/collaboration.   Nelia Shi, MD 11/02/12 1949

## 2014-08-10 ENCOUNTER — Ambulatory Visit (HOSPITAL_COMMUNITY)
Admission: RE | Admit: 2014-08-10 | Discharge: 2014-08-10 | Disposition: A | Discharge status: Home / Self Care | Payer: Managed Care, Other (non HMO) | Source: Ambulatory Visit | Attending: Family Medicine | Admitting: Family Medicine

## 2014-08-10 ENCOUNTER — Other Ambulatory Visit (HOSPITAL_COMMUNITY): Payer: Self-pay | Admitting: Family Medicine

## 2014-08-10 DIAGNOSIS — M545 Low back pain: Secondary | ICD-10-CM

## 2014-08-10 DIAGNOSIS — M5440 Lumbago with sciatica, unspecified side: Secondary | ICD-10-CM | POA: Insufficient documentation

## 2016-06-15 ENCOUNTER — Emergency Department (HOSPITAL_COMMUNITY): Payer: Managed Care, Other (non HMO)

## 2016-06-15 ENCOUNTER — Emergency Department (HOSPITAL_COMMUNITY)
Admission: EM | Admit: 2016-06-15 | Discharge: 2016-06-15 | Disposition: A | Discharge status: Home / Self Care | Payer: Managed Care, Other (non HMO) | Attending: Emergency Medicine | Admitting: Emergency Medicine

## 2016-06-15 ENCOUNTER — Encounter (HOSPITAL_COMMUNITY): Payer: Self-pay | Admitting: Emergency Medicine

## 2016-06-15 DIAGNOSIS — R1084 Generalized abdominal pain: Secondary | ICD-10-CM | POA: Diagnosis present

## 2016-06-15 DIAGNOSIS — Z79899 Other long term (current) drug therapy: Secondary | ICD-10-CM | POA: Diagnosis not present

## 2016-06-15 DIAGNOSIS — K8050 Calculus of bile duct without cholangitis or cholecystitis without obstruction: Secondary | ICD-10-CM

## 2016-06-15 DIAGNOSIS — R109 Unspecified abdominal pain: Secondary | ICD-10-CM

## 2016-06-15 DIAGNOSIS — N390 Urinary tract infection, site not specified: Secondary | ICD-10-CM

## 2016-06-15 DIAGNOSIS — Z87891 Personal history of nicotine dependence: Secondary | ICD-10-CM | POA: Insufficient documentation

## 2016-06-15 LAB — CBC WITH DIFFERENTIAL/PLATELET
BASOS ABS: 0 10*3/uL (ref 0.0–0.1)
Basophils Relative: 0 %
EOS ABS: 0.1 10*3/uL (ref 0.0–0.7)
EOS PCT: 1 %
HCT: 41.1 % (ref 36.0–46.0)
Hemoglobin: 13.8 g/dL (ref 12.0–15.0)
Lymphocytes Relative: 23 %
Lymphs Abs: 1.8 10*3/uL (ref 0.7–4.0)
MCH: 30.3 pg (ref 26.0–34.0)
MCHC: 33.6 g/dL (ref 30.0–36.0)
MCV: 90.1 fL (ref 78.0–100.0)
MONO ABS: 0.4 10*3/uL (ref 0.1–1.0)
Monocytes Relative: 6 %
NEUTROS ABS: 5.5 10*3/uL (ref 1.7–7.7)
Neutrophils Relative %: 70 %
PLATELETS: 234 10*3/uL (ref 150–400)
RBC: 4.56 MIL/uL (ref 3.87–5.11)
RDW: 13.3 % (ref 11.5–15.5)
WBC: 7.7 10*3/uL (ref 4.0–10.5)

## 2016-06-15 LAB — COMPREHENSIVE METABOLIC PANEL
ALT: 21 U/L (ref 14–54)
AST: 20 U/L (ref 15–41)
Albumin: 4.4 g/dL (ref 3.5–5.0)
Alkaline Phosphatase: 68 U/L (ref 38–126)
Anion gap: 11 (ref 5–15)
BUN: 11 mg/dL (ref 6–20)
CO2: 27 mmol/L (ref 22–32)
CREATININE: 0.71 mg/dL (ref 0.44–1.00)
Calcium: 9.5 mg/dL (ref 8.9–10.3)
Chloride: 102 mmol/L (ref 101–111)
GFR calc Af Amer: >60 mL/min (ref >60)
GFR calc non Af Amer: >60 mL/min (ref >60)
Glucose, Bld: 93 mg/dL (ref 65–99)
POTASSIUM: 3.5 mmol/L (ref 3.5–5.1)
Sodium: 140 mmol/L (ref 135–145)
TOTAL PROTEIN: 7.4 g/dL (ref 6.5–8.1)
Total Bilirubin: 0.8 mg/dL (ref 0.3–1.2)

## 2016-06-15 LAB — URINALYSIS, ROUTINE W REFLEX MICROSCOPIC
BILIRUBIN URINE: NEGATIVE
GLUCOSE, UA: NEGATIVE mg/dL
Hgb urine dipstick: NEGATIVE
KETONES UR: 5 mg/dL — AB
NITRITE: NEGATIVE
PH: 5 (ref 5.0–8.0)
Protein, ur: NEGATIVE mg/dL
Specific Gravity, Urine: 1.01 (ref 1.005–1.030)

## 2016-06-15 LAB — PREGNANCY, URINE: Preg Test, Ur: NEGATIVE

## 2016-06-15 LAB — LIPASE, BLOOD: Lipase: 15 U/L (ref 11–51)

## 2016-06-15 MED ORDER — CEPHALEXIN 500 MG PO CAPS: 500.0000 mg | ORAL_CAPSULE | Freq: Four times a day (QID) | ORAL | 0 refills | Status: DC

## 2016-06-15 MED ORDER — HYDROCODONE-ACETAMINOPHEN 5-325 MG PO TABS: ORAL_TABLET | ORAL | 0 refills | Status: DC

## 2016-06-15 MED ORDER — ONDANSETRON HCL 4 MG/2ML IJ SOLN
4.0000 mg | INTRAMUSCULAR | Status: DC | PRN
  Administered 2016-06-15: 4 mg via INTRAVENOUS
  Filled 2016-06-15: qty 2

## 2016-06-15 MED ORDER — ONDANSETRON HCL 4 MG PO TABS: 4.0000 mg | ORAL_TABLET | Freq: Three times a day (TID) | ORAL | 0 refills | Status: DC | PRN

## 2016-06-15 MED ORDER — IOPAMIDOL (ISOVUE-300) INJECTION 61%
100.0000 mL | Freq: Once | INTRAVENOUS | Status: AC | PRN
  Administered 2016-06-15: 100 mL via INTRAVENOUS

## 2016-06-15 MED ORDER — MORPHINE SULFATE (PF) 4 MG/ML IV SOLN
4.0000 mg | INTRAVENOUS | Status: DC | PRN
  Administered 2016-06-15: 4 mg via INTRAVENOUS
  Filled 2016-06-15: qty 1

## 2016-06-15 NOTE — ED Notes (Signed)
Pt states that her symptoms began on last Monday.  She states she has been having diarrhea and nausea after she eats.  She has not eaten today and states that she feel full and bloated.

## 2016-06-15 NOTE — ED Provider Notes (Signed)
[facility] DEPT Provider Note   CSN: [id number] Arrival date & time: 06/15/16  1427     History   Chief Complaint Chief Complaint  Patient presents with  . Abdominal Pain    HPI Kirsten Myers is a 36 y.o. female.  HPI  Pt was seen at 1500.  Per pt, c/o gradual onset and persistence of constant generalized abd "pain" for the past 1 week.  Has been associated with nausea and multiple intermittent episodes of diarrhea. States the pain worsens after eating. Describes the pain as "bloating." Denies vomiting, no fevers, no back pain, no rash, no CP/SOB, no black or blood in stools.      Past Medical History:  Diagnosis Date  . Migraine     There are no active problems to display for this patient.   Past Surgical History:  Procedure Laterality Date  . CYST EXCISION    . TUBAL LIGATION      OB History    No data available       Home Medications    Prior to Admission medications   Medication Sig Start Date End Date Taking? Authorizing Provider  acetaminophen (TYLENOL) 500 MG tablet Take 1,000 mg by mouth 2 (two) times daily as needed for pain.   Yes Historical Provider, MD    Family History History reviewed. No pertinent family history.  Social History Social History  Substance Use Topics  . Smoking status: Former Smoker    Packs/day: 0.50    Types: Cigarettes    Quit date: 11/14/2015  . Smokeless tobacco: Not on file  . Alcohol use No     Allergies   Aspirin   Review of Systems Review of Systems ROS: Statement: All systems negative except as marked or noted in the HPI; Constitutional: Negative for fever and chills. ; ; Eyes: Negative for eye pain, redness and discharge. ; ; ENMT: Negative for ear pain, hoarseness, nasal congestion, sinus pressure and sore throat. ; ; Cardiovascular: Negative for chest pain, palpitations, diaphoresis, dyspnea and peripheral edema. ; ; Respiratory: Negative for cough, wheezing and stridor. ; ; Gastrointestinal:  +nausea, diarrhea, abd pain. Negative for vomiting, blood in stool, hematemesis, jaundice and rectal bleeding. . ; ; Genitourinary: Negative for dysuria, flank pain and hematuria. ; ; Musculoskeletal: Negative for back pain and neck pain. Negative for swelling and trauma.; ; Skin: Negative for pruritus, rash, abrasions, blisters, bruising and skin lesion.; ; Neuro: Negative for headache, lightheadedness and neck stiffness. Negative for weakness, altered level of consciousness, altered mental status, extremity weakness, paresthesias, involuntary movement, seizure and syncope.       Physical Exam Updated Vital Signs BP 145/92 (BP Location: Left Arm)   Pulse 72   Temp 97.6 F (36.4 C) (Oral)   Resp 16   Ht 5\' 8"  (1.727 m)   Wt 250 lb (113.4 kg)   LMP 06/02/2016   SpO2 99%   BMI 38.01 kg/m   Physical Exam 1505: Physical examination:  Nursing notes reviewed; Vital signs and O2 SAT reviewed;  Constitutional: Well developed, Well nourished, Well hydrated, In no acute distress; Head:  Normocephalic, atraumatic; Eyes: EOMI, PERRL, No scleral icterus; ENMT: Mouth and pharynx normal, Mucous membranes moist; Neck: Supple, Full range of motion, No lymphadenopathy; Cardiovascular: Regular rate and rhythm, No gallop; Respiratory: Breath sounds clear & equal bilaterally, No wheezes.  Speaking full sentences with ease, Normal respiratory effort/excursion; Chest: Nontender, Movement normal; Abdomen: Soft, +RUQ > diffuse tenderness to palp. No rebound or guarding. Nondistended,  Normal bowel sounds; Genitourinary: No CVA tenderness; Extremities: Pulses normal, No tenderness, No edema, No calf edema or asymmetry.; Neuro: AA&Ox3, Major CN grossly intact.  Speech clear. No gross focal motor or sensory deficits in extremities.; Skin: Color normal, Warm, Dry.   ED Treatments / Results  Labs (all labs ordered are listed, but only abnormal results are displayed)   EKG  EKG Interpretation None        Radiology   Procedures Procedures (including critical care time)  Medications Ordered in ED Medications  morphine 4 MG/ML injection 4 mg (4 mg Intravenous Given 06/15/16 1539)  ondansetron (ZOFRAN) injection 4 mg (4 mg Intravenous Given 06/15/16 1540)     Initial Impression / Assessment and Plan / ED Course  I have reviewed the triage vital signs and the nursing notes.  Pertinent labs & imaging results that were available during my care of the patient were reviewed by me and considered in my medical decision making (see chart for details).  MDM Reviewed: previous chart, nursing note and vitals Reviewed previous: labs Interpretation: labs and CT scan    Results for orders placed or performed during the hospital encounter of 06/15/16  Comprehensive metabolic panel  Result Value Ref Range   Sodium 140 135 - 145 mmol/L   Potassium 3.5 3.5 - 5.1 mmol/L   Chloride 102 101 - 111 mmol/L   CO2 27 22 - 32 mmol/L   Glucose, Bld 93 65 - 99 mg/dL   BUN 11 6 - 20 mg/dL   Creatinine, Ser 2.13 0.44 - 1.00 mg/dL   Calcium 9.5 8.9 - 08.6 mg/dL   Total Protein 7.4 6.5 - 8.1 g/dL   Albumin 4.4 3.5 - 5.0 g/dL   AST 20 15 - 41 U/L   ALT 21 14 - 54 U/L   Alkaline Phosphatase 68 38 - 126 U/L   Total Bilirubin 0.8 0.3 - 1.2 mg/dL   GFR calc non Af Amer >60 >60 mL/min   GFR calc Af Amer >60 >60 mL/min   Anion gap 11 5 - 15  Lipase, blood  Result Value Ref Range   Lipase 15 11 - 51 U/L  CBC with Differential  Result Value Ref Range   WBC 7.7 4.0 - 10.5 K/uL   RBC 4.56 3.87 - 5.11 MIL/uL   Hemoglobin 13.8 12.0 - 15.0 g/dL   HCT 57.8 46.9 - 62.9 %   MCV 90.1 78.0 - 100.0 fL   MCH 30.3 26.0 - 34.0 pg   MCHC 33.6 30.0 - 36.0 g/dL   RDW 52.8 41.3 - 24.4 %   Platelets 234 150 - 400 K/uL   Neutrophils Relative % 70 %   Neutro Abs 5.5 1.7 - 7.7 K/uL   Lymphocytes Relative 23 %   Lymphs Abs 1.8 0.7 - 4.0 K/uL   Monocytes Relative 6 %   Monocytes Absolute 0.4 0.1 - 1.0 K/uL    Eosinophils Relative 1 %   Eosinophils Absolute 0.1 0.0 - 0.7 K/uL   Basophils Relative 0 %   Basophils Absolute 0.0 0.0 - 0.1 K/uL  Urinalysis, Routine w reflex microscopic  Result Value Ref Range   Color, Urine YELLOW YELLOW   APPearance HAZY (A) CLEAR   Specific Gravity, Urine 1.010 1.005 - 1.030   pH 5.0 5.0 - 8.0   Glucose, UA NEGATIVE NEGATIVE mg/dL   Hgb urine dipstick NEGATIVE NEGATIVE   Bilirubin Urine NEGATIVE NEGATIVE   Ketones, ur 5 (A) NEGATIVE mg/dL   Protein, ur  NEGATIVE NEGATIVE mg/dL   Nitrite NEGATIVE NEGATIVE   Leukocytes, UA MODERATE (A) NEGATIVE   RBC / HPF 0-5 0 - 5 RBC/hpf   WBC, UA 6-30 0 - 5 WBC/hpf   Bacteria, UA RARE (A) NONE SEEN   Mucous PRESENT   Pregnancy, urine  Result Value Ref Range   Preg Test, Ur NEGATIVE NEGATIVE   Ct Abdomen Pelvis W Contrast Result Date: 06/15/2016 CLINICAL DATA:  Mid to low abdominal pain for 1 week diarrhea and nausea EXAM: CT ABDOMEN AND PELVIS WITH CONTRAST TECHNIQUE: Multidetector CT imaging of the abdomen and pelvis was performed using the standard protocol following bolus administration of intravenous contrast. CONTRAST:  100mL ISOVUE-300 IOPAMIDOL (ISOVUE-300) INJECTION 61% COMPARISON:  12/03/2011 FINDINGS: Lower chest: Visualized lung bases demonstrate no acute consolidation or effusion. The heart size is normal. dilatation. Hepatobiliary: Slightly enlarged liver measuring 19 cm. Mild diffuse decreased density suggests mild fatty infiltration. No focal hepatic abnormality. Possible mild increased density within the gallbladder lumen but without calcified stone. No wall thickening. No biliary Pancreas: Unremarkable. No pancreatic ductal dilatation or surrounding inflammatory changes. Spleen: Normal in size without focal abnormality. Accessory splenule anteriorly. Adrenals/Urinary Tract: 3 mm stone lower pole left kidney. No hydronephrosis. Bladder normal. Stomach/Bowel: Stomach is within normal limits. Appendix appears normal.  No evidence of bowel wall thickening, distention, or inflammatory changes. Vascular/Lymphatic: No significant vascular findings are present. No enlarged abdominal or pelvic lymph nodes. Nonspecific subcentimeter retroperitoneal lymph nodes Reproductive: Bilateral tubal ligation clips.  No adnexal masses. Other: No abdominal wall hernia or abnormality. No abdominopelvic ascites. Musculoskeletal: No acute or significant osseous findings. IMPRESSION: 1. No CT evidence for acute intra-abdominal or pelvic pathology 2. Slightly enlarged liver with mild steatosis 3. Possible mild increased density in the gallbladder lumen, possible sludge. Could correlate with targeted ultrasound if clinically suspect gallbladder disease. 4. Nonobstructing stone in the left kidney. Electronically Signed   By: Jasmine PangKim  Fujinaga M.D.   On: 06/15/2016 18:52    1915:  LFT's normal. Pain controlled. Pt tol PO well without N/V while in the ED. No stooling while in the ED. Will have pt return tomorrow for outpatient US RUQ, f/u General Surgeon. Pt now endorses urinary frequency; will tx for UTI. Pt states she is ready to go home now. Dx and testing d/w pt and family.  Questions answered.  Verb understanding, agreeable to d/c home with outpt f/u.    Final Clinical Impressions(s) / ED Diagnoses   Final diagnoses:  None    New Prescriptions New Prescriptions   No medications on file     Samuel JesterKathleen Kymia Simi, DO 06/18/16 [id number]

## 2016-06-15 NOTE — ED Notes (Signed)
Discharge instructions reviewed with patient. Ultrasound appt made for 12:30 tomorrow morning, pt instructed not to eat or drink within 8 hours of test time.

## 2016-06-15 NOTE — ED Triage Notes (Signed)
Pt reports abd pain and D/ started 1 week ago, increase pain after eating. Denies fever, N/V/. Last D/ was yesterday.

## 2016-06-15 NOTE — Discharge Instructions (Signed)
Eat a bland diet, avoiding greasy, fatty, fried foods, as well as spicy and acidic foods or beverages.  Avoid eating within the hour or 2 before going to bed or laying down.  Also avoid teas, colas, coffee, chocolate, pepermint and spearment.  Take the prescriptions as directed.  Return tomorrow as instructed to obtain an outpatient ultrasound of your gallbladder. Call the General Surgeon tomorrow to schedule a follow up appointment within the next week.  Return to the Emergency Department immediately if worsening.

## 2016-06-16 ENCOUNTER — Ambulatory Visit (HOSPITAL_COMMUNITY)
Admission: RE | Admit: 2016-06-16 | Discharge: 2016-06-16 | Disposition: A | Discharge status: Home / Self Care | Payer: Managed Care, Other (non HMO) | Source: Ambulatory Visit | Attending: Emergency Medicine | Admitting: Emergency Medicine

## 2016-06-16 DIAGNOSIS — K760 Fatty (change of) liver, not elsewhere classified: Secondary | ICD-10-CM | POA: Diagnosis not present

## 2016-06-16 DIAGNOSIS — R1011 Right upper quadrant pain: Secondary | ICD-10-CM | POA: Diagnosis present

## 2016-06-16 DIAGNOSIS — K8020 Calculus of gallbladder without cholecystitis without obstruction: Secondary | ICD-10-CM | POA: Insufficient documentation

## 2016-06-16 NOTE — ED Provider Notes (Signed)
  Physical Exam  BP 126/72   Pulse 70   Temp 97.7 F (36.5 C) (Oral)   Resp 16   Ht 5\' 8"  (1.727 m)   Wt 250 lb (113.4 kg)   LMP 06/02/2016 Comment: NEG U PREG 06/15/16  SpO2 100%   BMI 38.01 kg/m   Physical Exam  ED Course  Procedures  MDM Informed pt of US results. Will follow with surgery       Benjiman CoreNathan Manha Amato, MD 06/16/16 1352

## 2016-07-07 ENCOUNTER — Encounter: Payer: Self-pay | Admitting: Internal Medicine

## 2016-07-14 ENCOUNTER — Ambulatory Visit (INDEPENDENT_AMBULATORY_CARE_PROVIDER_SITE_OTHER): Payer: Managed Care, Other (non HMO) | Admitting: Nurse Practitioner

## 2016-07-14 ENCOUNTER — Encounter: Payer: Self-pay | Admitting: Nurse Practitioner

## 2016-07-14 DIAGNOSIS — K760 Fatty (change of) liver, not elsewhere classified: Secondary | ICD-10-CM

## 2016-07-14 DIAGNOSIS — K219 Gastro-esophageal reflux disease without esophagitis: Secondary | ICD-10-CM | POA: Diagnosis not present

## 2016-07-14 DIAGNOSIS — R109 Unspecified abdominal pain: Secondary | ICD-10-CM | POA: Insufficient documentation

## 2016-07-14 DIAGNOSIS — R1084 Generalized abdominal pain: Secondary | ICD-10-CM | POA: Diagnosis not present

## 2016-07-14 DIAGNOSIS — R1013 Epigastric pain: Secondary | ICD-10-CM | POA: Diagnosis not present

## 2016-07-14 MED ORDER — SUCRALFATE 1 GM/10ML PO SUSP: 1.0000 g | Freq: Four times a day (QID) | ORAL | 1 refills | Status: DC

## 2016-07-14 MED ORDER — PANTOPRAZOLE SODIUM 40 MG PO TBEC: 40.0000 mg | DELAYED_RELEASE_TABLET | Freq: Two times a day (BID) | ORAL | 3 refills | Status: DC

## 2016-07-14 NOTE — Patient Instructions (Addendum)
1. Avoid all "NSAID" medications. These include things such as aspirin, Motrin, ibuprofen, Aleve, Advil, and anything else with "NSAID" on the container. Tylenol is okay to take. 2. I sent and Protonix 40 mg to your pharmacy. Take 1 pill, twice a day, 30 minutes before a meal. 3. I sent and Carafate liquid for any breakthrough symptoms. 4. Call us if any worsening symptoms. 5. Otherwise, we will see you in 6 weeks.    Fatty Liver Introduction Fatty liver, also called hepatic steatosis or steatohepatitis, is a condition in which too much fat has built up in your liver cells. The liver removes harmful substances from your bloodstream. It produces fluids your body needs. It also helps your body use and store energy from the food you eat. In many cases, fatty liver does not cause symptoms or problems. It is often diagnosed when tests are being done for other reasons. However, over time, fatty liver can cause inflammation that may lead to more serious liver problems, such as scarring of the liver (cirrhosis). What are the causes? Causes of fatty liver may include:  Drinking too much alcohol.  Poor nutrition.  Obesity.  Cushing syndrome.  Diabetes.  Hyperlipidemia.  Pregnancy.  Certain drugs.  Poisons.  Some viral infections. What increases the risk? You may be more likely to develop fatty liver if you:  Abuse alcohol.  Are pregnant.  Are overweight.  Have diabetes.  Have hepatitis.  Have a high triglyceride level. What are the signs or symptoms? Fatty liver often does not cause any symptoms. In cases where symptoms develop, they can include:  Fatigue.  Weakness.  Weight loss.  Confusion.  Abdominal pain.  Yellowing of your skin and the white parts of your eyes (jaundice).  Nausea and vomiting. How is this diagnosed? Fatty liver may be diagnosed by:  Physical exam and medical history.  Blood tests.  Imaging tests, such as an ultrasound, CT scan, or  MRI.  Liver biopsy. A small sample of liver tissue is removed using a needle. The sample is then looked at under a microscope. How is this treated? Fatty liver is often caused by other health conditions. Treatment for fatty liver may involve medicines and lifestyle changes to manage conditions such as:  Alcoholism.  High cholesterol.  Diabetes.  Being overweight or obese. Follow these instructions at home:  Eat a healthy diet as directed by your health care provider.  Exercise regularly. This can help you lose weight and control your cholesterol and diabetes. Talk to your health care provider about an exercise plan and which activities are best for you.  Do not drink alcohol.  Take medicines only as directed by your health care provider. Contact a health care provider if: You have difficulty controlling your:  Blood sugar.  Cholesterol.  Alcohol consumption. Get help right away if:  You have abdominal pain.  You have jaundice.  You have nausea and vomiting. This information is not intended to replace advice given to you by your health care provider. Make sure you discuss any questions you have with your health care provider. Document Released: 07/04/2005 Document Revised: 10/25/2015 Document Reviewed: 09/28/2013  2017 Elsevier     Gastroesophageal Reflux Disease, Adult Normally, food travels down the esophagus and stays in the stomach to be digested. However, when a person has gastroesophageal reflux disease (GERD), food and stomach acid move back up into the esophagus. When this happens, the esophagus becomes sore and inflamed. Over time, GERD can create small holes (ulcers)  in the lining of the esophagus. What are the causes? This condition is caused by a problem with the muscle between the esophagus and the stomach (lower esophageal sphincter, or LES). Normally, the LES muscle closes after food passes through the esophagus to the stomach. When the LES is weakened or  abnormal, it does not close properly, and that allows food and stomach acid to go back up into the esophagus. The LES can be weakened by certain dietary substances, medicines, and medical conditions, including:  Tobacco use.  Pregnancy.  Having a hiatal hernia.  Heavy alcohol use.  Certain foods and beverages, such as coffee, chocolate, onions, and peppermint. What increases the risk? This condition is more likely to develop in:  People who have an increased body weight.  People who have connective tissue disorders.  People who use NSAID medicines. What are the signs or symptoms? Symptoms of this condition include:  Heartburn.  Difficult or painful swallowing.  The feeling of having a lump in the throat.  Abitter taste in the mouth.  Bad breath.  Having a large amount of saliva.  Having an upset or bloated stomach.  Belching.  Chest pain.  Shortness of breath or wheezing.  Ongoing (chronic) cough or a night-time cough.  Wearing away of tooth enamel.  Weight loss. Different conditions can cause chest pain. Make sure to see your health care provider if you experience chest pain. How is this diagnosed? Your health care provider will take a medical history and perform a physical exam. To determine if you have mild or severe GERD, your health care provider may also monitor how you respond to treatment. You may also have other tests, including:  An endoscopy toexamine your stomach and esophagus with a small camera.  A test thatmeasures the acidity level in your esophagus.  A test thatmeasures how much pressure is on your esophagus.  A barium swallow or modified barium swallow to show the shape, size, and functioning of your esophagus. How is this treated? The goal of treatment is to help relieve your symptoms and to prevent complications. Treatment for this condition may vary depending on how severe your symptoms are. Your health care provider may  recommend:  Changes to your diet.  Medicine.  Surgery. Follow these instructions at home: Diet  Follow a diet as recommended by your health care provider. This may involve avoiding foods and drinks such as:  Coffee and tea (with or without caffeine).  Drinks that containalcohol.  Energy drinks and sports drinks.  Carbonated drinks or sodas.  Chocolate and cocoa.  Peppermint and mint flavorings.  Garlic and onions.  Horseradish.  Spicy and acidic foods, including peppers, chili powder, curry powder, vinegar, hot sauces, and barbecue sauce.  Citrus fruit juices and citrus fruits, such as oranges, lemons, and limes.  Tomato-based foods, such as red sauce, chili, salsa, and pizza with red sauce.  Fried and fatty foods, such as donuts, french fries, potato chips, and high-fat dressings.  High-fat meats, such as hot dogs and fatty cuts of red and white meats, such as rib eye steak, sausage, ham, and bacon.  High-fat dairy items, such as whole milk, butter, and cream cheese.  Eat small, frequent meals instead of large meals.  Avoid drinking large amounts of liquid with your meals.  Avoid eating meals during the 2-3 hours before bedtime.  Avoid lying down right after you eat.  Do not exercise right after you eat. General instructions  Pay attention to any changes in  your symptoms.  Take over-the-counter and prescription medicines only as told by your health care provider. Do not take aspirin, ibuprofen, or other NSAIDs unless your health care provider told you to do so.  Do not use any tobacco products, including cigarettes, chewing tobacco, and e-cigarettes. If you need help quitting, ask your health care provider.  Wear loose-fitting clothing. Do not wear anything tight around your waist that causes pressure on your abdomen.  Raise (elevate) the head of your bed 6 inches (15cm).  Try to reduce your stress, such as with yoga or meditation. If you need help  reducing stress, ask your health care provider.  If you are overweight, reduce your weight to an amount that is healthy for you. Ask your health care provider for guidance about a safe weight loss goal.  Keep all follow-up visits as told by your health care provider. This is important. Contact a health care provider if:  You have new symptoms.  You have unexplained weight loss.  You have difficulty swallowing, or it hurts to swallow.  You have wheezing or a persistent cough.  Your symptoms do not improve with treatment.  You have a hoarse voice. Get help right away if:  You have pain in your arms, neck, jaw, teeth, or back.  You feel sweaty, dizzy, or light-headed.  You have chest pain or shortness of breath.  You vomit and your vomit looks like blood or coffee grounds.  You faint.  Your stool is bloody or black.  You cannot swallow, drink, or eat. This information is not intended to replace advice given to you by your health care provider. Make sure you discuss any questions you have with your health care provider. Document Released: 02/26/2005 Document Revised: 10/17/2015 Document Reviewed: 09/13/2014 Elsevier Interactive Patient Education  2017 [employer].     Food Choices for Gastroesophageal Reflux Disease, Adult When you have gastroesophageal reflux disease (GERD), the foods you eat and your eating habits are very important. Choosing the right foods can help ease the discomfort of GERD. What general guidelines do I need to follow?  Choose fruits, vegetables, whole grains, low-fat dairy products, and low-fat meat, fish, and poultry.  Limit fats such as oils, salad dressings, butter, nuts, and avocado.  Keep a food diary to identify foods that cause symptoms.  Avoid foods that cause reflux. These may be different for different people.  Eat frequent small meals instead of three large meals each day.  Eat your meals slowly, in a relaxed setting.  Limit  fried foods.  Cook foods using methods other than frying.  Avoid drinking alcohol.  Avoid drinking large amounts of liquids with your meals.  Avoid bending over or lying down until 2-3 hours after eating. What foods are not recommended? The following are some foods and drinks that may worsen your symptoms: Vegetables  Tomatoes. Tomato juice. Tomato and spaghetti sauce. Chili peppers. Onion and garlic. Horseradish. Fruits  Oranges, grapefruit, and lemon (fruit and juice). Meats  High-fat meats, fish, and poultry. This includes hot dogs, ribs, ham, sausage, salami, and bacon. Dairy  Whole milk and chocolate milk. Sour cream. Cream. Butter. Ice cream. Cream cheese. Beverages  Coffee and tea, with or without caffeine. Carbonated beverages or energy drinks. Condiments  Hot sauce. Barbecue sauce. Sweets/Desserts  Chocolate and cocoa. Donuts. Peppermint and spearmint. Fats and Oils  High-fat foods, including [place] fries and potato chips. Other  Vinegar. Strong spices, such as black pepper, white pepper, red pepper, cayenne, curry powder,  cloves, ginger, and chili powder. The items listed above may not be a complete list of foods and beverages to avoid. Contact your dietitian for more information.  This information is not intended to replace advice given to you by your health care provider. Make sure you discuss any questions you have with your health care provider. Document Released: 05/19/2005 Document Revised: 10/25/2015 Document Reviewed: 03/23/2013 Elsevier Interactive Patient Education  2017 [employer].

## 2016-07-14 NOTE — Progress Notes (Signed)
cc'ed to pcp °

## 2016-07-14 NOTE — Assessment & Plan Note (Signed)
Generalized abdominal pain for the past 5-6 weeks. Takes NSAIDs about every other day. Has a long-standing history of GERD. Abdominal ultrasound and CT of the abdomen and pelvis are reassuring. At this point high in the differentials are GERD exacerbation, gastritis, duodenitis, peptic ulcer disease, duodenal ulcer. She is not currently on a PPI. I'll start her on Protonix 40 mg twice a day for 3 months at which point we can consider down dosing based on clinical response. I will also send in Carafate for breakthrough symptoms. She is counseled to avoid all NSAIDs. Return for follow-up in 6 weeks. Call us if worsening symptoms before then.

## 2016-07-14 NOTE — Assessment & Plan Note (Signed)
Noted dyspepsia symptoms including nausea, occasional diarrhea in addition to her abdominal pain as noted below. History of GERD. Protonix 40 mg twice a day for 3 months and consider down dosing to once a day at that point. Carafate for breakthrough. Avoid all NSAIDs. Return for follow-up in 6 weeks. I suspect her symptoms are likely due to gastritis or erosions, possible duodenitis. She could be developing a gastric ulcer or duodenal ulcer. If her symptoms do not improve her Protonix we will likely need to schedule upper endoscopy to further evaluate.

## 2016-07-14 NOTE — Assessment & Plan Note (Signed)
Long-standing history of acid reflux now with worsening dyspepsia symptoms as per below. I will start her on a PPI as per below as she is not on any medications currently. Return for follow-up in 6-8 weeks.

## 2016-07-14 NOTE — Progress Notes (Signed)
Primary Care Physician:  Cassell SmilesFUSCO,LAWRENCE J., MD Primary Gastroenterologist:  Dr. Jena Gaussourk  Chief Complaint  Patient presents with  . Gastroesophageal Reflux  . Nausea    after eating  . Diarrhea    after eating  . Abdominal Pain    severe at times, worse at night, mid abd    HPI:   Kirsten Myers is a 36 y.o. female who presents On referral from the emergency department for GERD and abdominal pain. The patient was seen in the emergency department 06/16/2016 with a chief complaint of abdominal pain which, per notes, the patient states was gradual and persistent, constant, generalized pain for the previous week associated with nausea and intermittent episodes of diarrhea which are worse after eating. Also complains of bloating. CMP was normal for kidney and liver function, lipase normal, CBC without leukocytosis and normal hemoglobin of 13.8. CT of the abdomen and pelvis with contrast found no CT evidence for acute intra-abdominal or pelvic pathology, slightly enlarged liver with mild steatosis, possible mild increased density in the gallbladder lumen with possible sludge, non-obstructing stone in the left kidney. The patient tolerated oral fluids without nausea and vomiting in the ED and was discharged with recommendation to follow up as an outpatient for ultrasound of the right upper quadrant and see a general surgeon. Ultrasound showed cholelithiasis without evidence of cholecystitis with the largest stone being 16 mm, no common bile duct dilation, hepatic steatosis.  Today she states she's doing ok overall. Still with abdominal pain. She was discharged with Zofran and hydrocodone (she would get headaches from the Hydrocodone). Zofran helped the nausea. Abdominal pain started about 5-6 weeks ago and occurs about every other day. Worse at night when she lays flat, where she cannot sleep. Pain is generalized, described as burning and feels bloated. Has nausea as well. Has not vomited. Denies  worsening reflux symptoms, does have chronic reflux. Has diarrhea intermittently, described as watery with about 3 stools in a day; otherwise [place] 4 stools. Unsure if she had hematochezia "the other day, when I felt constipated. Denies melena. Takes Aleve about every other day for her severe pain. Denies ASA powders. Denies chest pain, dyspnea, dizziness, lightheadedness, syncope, near syncope. Denies any other upper or lower GI symptoms.  Past Medical History:  Diagnosis Date  . GERD (gastroesophageal reflux disease)   . Migraine     Past Surgical History:  Procedure Laterality Date  . CYST EXCISION    . TUBAL LIGATION      Current Outpatient Prescriptions  Medication Sig Dispense Refill  . acetaminophen (TYLENOL) 500 MG tablet Take 1,000 mg by mouth 2 (two) times daily as needed for pain.     No current facility-administered medications for this visit.     Allergies as of 07/14/2016 - Review Complete 07/14/2016  Allergen Reaction Noted  . Aspirin Rash and Other (See Comments) 12/03/2011    Family History  Problem Relation Age of Onset  . Colon cancer Father 2559    Passed away from colon [place]    Social History   Social History  . Marital status: Married    Spouse name: N/A  . Number of children: N/A  . Years of education: N/A   Occupational History  . Not on file.   Social History Main Topics  . Smoking status: Former Smoker    Packs/day: 0.50    Types: Cigarettes    Quit date: 11/14/2015  . Smokeless tobacco: Never Used  . Alcohol use No  .  Drug use: No  . Sexual activity: Yes    Birth control/ protection: Surgical   Other Topics Concern  . Not on file   Social History Narrative  . No narrative on file    Review of Systems: General: Negative for anorexia, weight loss, fever, chills, fatigue, weakness. ENT: Negative for hoarseness, difficulty swallowing. CV: Negative for chest pain, angina, palpitations, peripheral edema.  Respiratory: Negative for  dyspnea at rest, cough, sputum, wheezing.  GI: See history of present illness. MS: Negative for joint pain, low back pain.  Derm: Negative for rash or itching.  Endo: Negative for unusual weight change.  Heme: Negative for bruising or bleeding. Allergy: Negative for rash or hives.    Physical Exam: BP 129/84   Pulse 71   Temp 98 F (36.7 C) (Oral)   Ht 5\' 8"  (1.727 m)   Wt (!) 307 lb 12.8 oz (139.6 kg)   LMP 06/30/2016 (Approximate)   BMI 46.80 kg/m  General:   Obese female. Alert and oriented. Pleasant and cooperative. Well-nourished and well-developed.  Head:  Normocephalic and atraumatic. Eyes:  Without icterus, sclera clear and conjunctiva pink.  Ears:  Normal auditory acuity. Cardiovascular:  S1, S2 present without murmurs appreciated. Extremities without clubbing or edema. Respiratory:  Clear to auscultation bilaterally. No wheezes, rales, or rhonchi. No distress.  Gastrointestinal:  +BS, rounded but soft, non-tender and non-distended. No HSM noted. No guarding or rebound. No masses appreciated.  Rectal:  Deferred  Musculoskalatal:  Symmetrical without gross deformities. Neurologic:  Alert and oriented x4;  grossly normal neurologically. Psych:  Alert and cooperative. Normal mood and affect. Heme/Lymph/Immune: No excessive bruising noted.    07/14/2016 8:50 AM   Disclaimer: This note was dictated with voice recognition software. Similar sounding words can inadvertently be transcribed and may not be corrected upon review.

## 2016-07-14 NOTE — Assessment & Plan Note (Signed)
Noted hepatic steatosis on ultrasound and CT. Discussed fatty liver with the patient. Informed her of the hallmarks of treatment including diet and exercise, glycemic control, blood pressure control. She verbalized understanding. I will provide more information for her.

## 2016-08-25 ENCOUNTER — Encounter: Payer: Self-pay | Admitting: Nurse Practitioner

## 2016-08-25 ENCOUNTER — Ambulatory Visit: Payer: Managed Care, Other (non HMO) | Admitting: Nurse Practitioner

## 2016-08-25 ENCOUNTER — Telehealth: Payer: Self-pay | Admitting: Nurse Practitioner

## 2016-08-25 NOTE — Telephone Encounter (Signed)
Noted  

## 2016-08-25 NOTE — Telephone Encounter (Signed)
Pt was a no show and letter sent  °

## 2016-12-29 ENCOUNTER — Emergency Department (HOSPITAL_COMMUNITY)
Admission: EM | Admit: 2016-12-29 | Discharge: 2016-12-29 | Disposition: A | Discharge status: Home / Self Care | Payer: Managed Care, Other (non HMO) | Attending: Emergency Medicine | Admitting: Emergency Medicine

## 2016-12-29 ENCOUNTER — Encounter (HOSPITAL_COMMUNITY): Payer: Self-pay | Admitting: *Deleted

## 2016-12-29 DIAGNOSIS — Z87891 Personal history of nicotine dependence: Secondary | ICD-10-CM | POA: Diagnosis not present

## 2016-12-29 DIAGNOSIS — R519 Headache, unspecified: Secondary | ICD-10-CM

## 2016-12-29 DIAGNOSIS — R51 Headache: Secondary | ICD-10-CM | POA: Diagnosis not present

## 2016-12-29 DIAGNOSIS — Z79899 Other long term (current) drug therapy: Secondary | ICD-10-CM | POA: Diagnosis not present

## 2016-12-29 DIAGNOSIS — R600 Localized edema: Secondary | ICD-10-CM

## 2016-12-29 DIAGNOSIS — R2240 Localized swelling, mass and lump, unspecified lower limb: Secondary | ICD-10-CM | POA: Diagnosis present

## 2016-12-29 DIAGNOSIS — R2242 Localized swelling, mass and lump, left lower limb: Secondary | ICD-10-CM | POA: Insufficient documentation

## 2016-12-29 LAB — BASIC METABOLIC PANEL
ANION GAP: 5 (ref 5–15)
BUN: 14 mg/dL (ref 6–20)
CHLORIDE: 105 mmol/L (ref 101–111)
CO2: 28 mmol/L (ref 22–32)
Calcium: 9.1 mg/dL (ref 8.9–10.3)
Creatinine, Ser: 0.8 mg/dL (ref 0.44–1.00)
GFR calc Af Amer: >60 mL/min (ref >60)
GLUCOSE: 107 mg/dL — AB (ref 65–99)
POTASSIUM: 3.9 mmol/L (ref 3.5–5.1)
Sodium: 138 mmol/L (ref 135–145)

## 2016-12-29 LAB — D-DIMER, QUANTITATIVE (NOT AT ARMC)

## 2016-12-29 MED ORDER — KETOROLAC TROMETHAMINE 30 MG/ML IJ SOLN
30.0000 mg | Freq: Once | INTRAMUSCULAR | Status: AC
  Administered 2016-12-29: 30 mg via INTRAMUSCULAR
  Filled 2016-12-29: qty 1

## 2016-12-29 NOTE — ED Provider Notes (Signed)
[facility]-EMERGENCY DEPT Provider Note   CSN: [id number] Arrival date & time: 12/29/16  0055     History   Chief Complaint Chief Complaint  Patient presents with  . Leg Swelling    HPI Kirsten Myers is a 36 y.o. female.  HPI  This is a 36 year old female with a history of migraines who presents with lower extremity swelling and headache. Patient reports one-day history of bilateral foot and ankle swelling. Denies any increased salt intake in the diet, increased standing or dependent activities. Denies history of blood clots. Does report increased "tingling" in the left leg. She denies chest pain or shortness of breath. She denies urinary symptoms. Patient also reports a frontal headache. She has a history of migraines but states that this feels different. She took ibuprofen at home with minimal relief. Current pain is 5 out of 10. She denies neck pain or fevers. She denies any focal weakness, numbness, or tingling.  Past Medical History:  Diagnosis Date  . GERD (gastroesophageal reflux disease)   . Migraine     Patient Active Problem List   Diagnosis Date Noted  . Hepatic steatosis 07/14/2016  . GERD (gastroesophageal reflux disease) 07/14/2016  . Dyspepsia 07/14/2016  . Abdominal pain 07/14/2016    Past Surgical History:  Procedure Laterality Date  . CYST EXCISION    . TUBAL LIGATION      OB History    No data available       Home Medications    Prior to Admission medications   Medication Sig Start Date End Date Taking? Authorizing Provider  acetaminophen (TYLENOL) 500 MG tablet Take 1,000 mg by mouth 2 (two) times daily as needed for pain.    [provider]  pantoprazole (PROTONIX) 40 MG tablet Take 1 tablet (40 mg total) by mouth 2 (two) times daily before a meal. 07/14/16   Anice PaganiniGill, Eric A, NP  sucralfate (CARAFATE) 1 GM/10ML suspension Take 10 mLs (1 g total) by mouth 4 (four) times daily. 07/14/16   Anice PaganiniGill, Eric A, NP    Family History Family History    Problem Relation Age of Onset  . Colon cancer Father 2059       Passed away from colon [place]    Social History Social History  Substance Use Topics  . Smoking status: Former Smoker    Packs/day: 0.50    Types: Cigarettes    Quit date: 11/14/2015  . Smokeless tobacco: Never Used  . Alcohol use No     Allergies   Aspirin   Review of Systems Review of Systems  Constitutional: Negative for fever.  Respiratory: Negative for shortness of breath.   Cardiovascular: Positive for leg swelling. Negative for chest pain.  Gastrointestinal: Negative for nausea and vomiting.  Genitourinary: Negative for dysuria.  Musculoskeletal: Negative for back pain.  Neurological: Positive for numbness and headaches. Negative for weakness.  All other systems reviewed and are negative.    Physical Exam Updated Vital Signs BP (!) 145/79 (BP Location: Right Arm)   Pulse 61   Temp 98.1 F (36.7 C) (Oral)   Ht 5\' 8"  (1.727 m)   Wt 127 kg (280 lb)   LMP 11/28/2016   SpO2 98%   BMI 42.57 kg/m   Physical Exam  Constitutional: She is oriented to person, place, and time. She appears well-developed and well-nourished. No distress.  Overweight  HENT:  Head: Normocephalic and atraumatic.  Eyes: Pupils are equal, round, and reactive to light.  Cardiovascular: Normal rate, regular  rhythm and normal heart sounds.   No murmur heard. Pulmonary/Chest: Effort normal and breath sounds normal. No respiratory distress. She has no wheezes.  Abdominal: Soft. Bowel sounds are normal.  Musculoskeletal:  1+ pedal edema bilaterally, symmetric, no calf tenderness  Neurological: She is alert and oriented to person, place, and time.  5 out of 5 strength in all 4 extremities, no dysmetria to finger-nose-finger, cranial nerves II through XII intact  Skin: Skin is warm and dry.  Psychiatric: She has a normal mood and affect.  Nursing note and vitals reviewed.    ED Treatments / Results  Labs (all labs ordered are  listed, but only abnormal results are displayed) Labs Reviewed  BASIC METABOLIC PANEL - Abnormal; Notable for the following:       Result Value   Glucose, Bld 107 (*)    All other components within normal limits  D-DIMER, QUANTITATIVE (NOT AT [facility])    EKG  EKG Interpretation None       Radiology No results found.  Procedures Procedures (including critical care time)  Medications Ordered in ED Medications  ketorolac (TORADOL) 30 MG/ML injection 30 mg (30 mg Intramuscular Given 12/29/16 0209)     Initial Impression / Assessment and Plan / ED Course  I have reviewed the triage vital signs and the nursing notes.  Pertinent labs & imaging results that were available during my care of the patient were reviewed by me and considered in my medical decision making (see chart for details).     Patient presents with lower extremity swelling as well as numbness to left leg and headache. She is nontoxic. Neurologic exam is reassuring. Swelling appears to be dependent. However she also reports numbness in the left lower extremity. We'll send a screening d-dimer. She is otherwise low risk for blood clots and would be atypical to have them bilaterally. She was given Toradol for her headache. BMP obtained for metabolic derangement. This is reassuring. On repeat exam, patient is resting comfortably. Headache improved. Discussed with patient continued monitoring at home. Recommend elevation and compression. If not improved, follow-up with primary physician. If she develops shortness of breath or chest pain, she should be reevaluated.  After history, exam, and medical workup I feel the patient has been appropriately medically screened and is safe for discharge home. Pertinent diagnoses were discussed with the patient. Patient was given return precautions.   Final Clinical Impressions(s) / ED Diagnoses   Final diagnoses:  Leg edema  Acute nonintractable headache, unspecified headache type     New Prescriptions New Prescriptions   No medications on file     Shon BatonHorton, Tandy Lewin F, MD 12/29/16 [phone]

## 2016-12-29 NOTE — Discharge Instructions (Signed)
You were seen today for leg edema. Elevate your legs at night. Reduce salt intake in your diet. If you develop worsening swelling, shortness of breath or any new or worsening symptoms she should be reevaluated.

## 2016-12-29 NOTE — ED Triage Notes (Signed)
Pt c/o headache and bilateral lower leg and foot pain x 1 day; pt states she is also having tingling to her left leg and foot

## 2017-01-25 ENCOUNTER — Emergency Department (HOSPITAL_COMMUNITY): Payer: Managed Care, Other (non HMO)

## 2017-01-25 ENCOUNTER — Encounter (HOSPITAL_COMMUNITY): Payer: Self-pay

## 2017-01-25 ENCOUNTER — Emergency Department (HOSPITAL_COMMUNITY)
Admission: EM | Admit: 2017-01-25 | Discharge: 2017-01-25 | Disposition: A | Discharge status: Home / Self Care | Payer: Managed Care, Other (non HMO) | Attending: Emergency Medicine | Admitting: Emergency Medicine

## 2017-01-25 DIAGNOSIS — Z79899 Other long term (current) drug therapy: Secondary | ICD-10-CM | POA: Insufficient documentation

## 2017-01-25 DIAGNOSIS — M25531 Pain in right wrist: Secondary | ICD-10-CM | POA: Insufficient documentation

## 2017-01-25 DIAGNOSIS — F17210 Nicotine dependence, cigarettes, uncomplicated: Secondary | ICD-10-CM | POA: Diagnosis not present

## 2017-01-25 DIAGNOSIS — M25562 Pain in left knee: Secondary | ICD-10-CM

## 2017-01-25 MED ORDER — CYCLOBENZAPRINE HCL 10 MG PO TABS: 10.0000 mg | ORAL_TABLET | Freq: Two times a day (BID) | ORAL | 0 refills | Status: DC | PRN

## 2017-01-25 NOTE — ED Notes (Signed)
Patient transported to X-ray 
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
## 2017-01-25 NOTE — Discharge Instructions (Signed)
Please read attached information regarding your condition and heat therapy. Take Flexeril and anti-inflammatories or Tylenol scheduled to prevent pain and inflammation. Follow up with PCP regarding swelling of foot and further management. Wear wrist splint at night and is much as possible. Wear knee sleeve as needed. Return to ED for worsening pain, additional injury, numbness, head injury, vomiting, vision changes or lightheadedness.

## 2017-02-07 ENCOUNTER — Emergency Department (HOSPITAL_COMMUNITY): Payer: Managed Care, Other (non HMO)

## 2017-02-07 ENCOUNTER — Encounter (HOSPITAL_COMMUNITY): Payer: Self-pay | Admitting: Emergency Medicine

## 2017-02-07 DIAGNOSIS — R079 Chest pain, unspecified: Secondary | ICD-10-CM | POA: Insufficient documentation

## 2017-02-07 DIAGNOSIS — Z5321 Procedure and treatment not carried out due to patient leaving prior to being seen by health care provider: Secondary | ICD-10-CM | POA: Diagnosis not present

## 2017-02-07 NOTE — ED Triage Notes (Signed)
Pt having nausea and vomiting all day long, now feeling dizzy and central 8/10 cp with sOB.

## 2017-02-08 ENCOUNTER — Emergency Department (HOSPITAL_COMMUNITY)
Admission: EM | Admit: 2017-02-08 | Discharge: 2017-02-08 | Disposition: A | Discharge status: Home / Self Care | Payer: Managed Care, Other (non HMO) | Attending: Emergency Medicine | Admitting: Emergency Medicine

## 2017-02-08 LAB — CBC
HEMATOCRIT: 40.5 % (ref 36.0–46.0)
HEMOGLOBIN: 13.7 g/dL (ref 12.0–15.0)
MCH: 30 pg (ref 26.0–34.0)
MCHC: 33.8 g/dL (ref 30.0–36.0)
MCV: 88.8 fL (ref 78.0–100.0)
Platelets: 188 10*3/uL (ref 150–400)
RBC: 4.56 MIL/uL (ref 3.87–5.11)
RDW: 13.4 % (ref 11.5–15.5)
WBC: 4.4 10*3/uL (ref 4.0–10.5)

## 2017-02-08 LAB — BASIC METABOLIC PANEL
Anion gap: 9 (ref 5–15)
BUN: 12 mg/dL (ref 6–20)
CO2: 21 mmol/L — AB (ref 22–32)
Calcium: 8.6 mg/dL — ABNORMAL LOW (ref 8.9–10.3)
Chloride: 102 mmol/L (ref 101–111)
Creatinine, Ser: 0.88 mg/dL (ref 0.44–1.00)
GFR calc Af Amer: >60 mL/min (ref >60)
Glucose, Bld: 114 mg/dL — ABNORMAL HIGH (ref 65–99)
POTASSIUM: 3.5 mmol/L (ref 3.5–5.1)
Sodium: 132 mmol/L — ABNORMAL LOW (ref 135–145)

## 2017-02-08 LAB — I-STAT TROPONIN, ED: Troponin i, poc: 0 ng/mL (ref 0.00–0.08)

## 2017-02-08 NOTE — ED Notes (Signed)
Called again for vitals with no answer. 

## 2017-02-08 NOTE — ED Notes (Signed)
Called for room with no answer.  °

## 2017-02-08 NOTE — ED Notes (Signed)
Called for vitals with no response 

## 2017-06-30 ENCOUNTER — Emergency Department (HOSPITAL_COMMUNITY)
Admission: EM | Admit: 2017-06-30 | Discharge: 2017-06-30 | Disposition: A | Discharge status: Home / Self Care | Payer: Managed Care, Other (non HMO) | Attending: Emergency Medicine | Admitting: Emergency Medicine

## 2017-06-30 ENCOUNTER — Other Ambulatory Visit: Payer: Self-pay

## 2017-06-30 ENCOUNTER — Emergency Department (HOSPITAL_COMMUNITY): Payer: Managed Care, Other (non HMO)

## 2017-06-30 ENCOUNTER — Encounter (HOSPITAL_COMMUNITY): Payer: Self-pay | Admitting: Emergency Medicine

## 2017-06-30 DIAGNOSIS — J111 Influenza due to unidentified influenza virus with other respiratory manifestations: Secondary | ICD-10-CM

## 2017-06-30 DIAGNOSIS — Z87891 Personal history of nicotine dependence: Secondary | ICD-10-CM | POA: Diagnosis not present

## 2017-06-30 DIAGNOSIS — Z79899 Other long term (current) drug therapy: Secondary | ICD-10-CM | POA: Insufficient documentation

## 2017-06-30 DIAGNOSIS — R69 Illness, unspecified: Secondary | ICD-10-CM

## 2017-06-30 DIAGNOSIS — R05 Cough: Secondary | ICD-10-CM | POA: Diagnosis present

## 2017-06-30 MED ORDER — FLUTICASONE PROPIONATE 50 MCG/ACT NA SUSP: 1.0000 | Freq: Every day | NASAL | 2 refills | Status: DC

## 2017-06-30 MED ORDER — BENZONATATE 100 MG PO CAPS: 100.0000 mg | ORAL_CAPSULE | Freq: Three times a day (TID) | ORAL | 0 refills | Status: DC

## 2017-06-30 MED ORDER — IBUPROFEN 800 MG PO TABS: 800.0000 mg | ORAL_TABLET | Freq: Three times a day (TID) | ORAL | 0 refills | Status: DC | PRN

## 2017-06-30 NOTE — ED Triage Notes (Signed)
Pt c/o of productive cough, congestion, headache for 3 days. No fever.  Generalized body aches.

## 2017-06-30 NOTE — ED Provider Notes (Signed)
[facility] EMERGENCY DEPARTMENT Provider Note   CSN: [id number] Arrival date & time: 06/30/17  1219     History   Chief Complaint Chief Complaint  Patient presents with  . Cough    HPI Kirsten Myers is a 37 y.o. female with a hx of GERD who presents to the ED with multiple complaints that started 3 days ago with most prominent being cough. Patient states she has been experiencing subjective fevers, chills, congestion, generalized body aches, fatigue, ear pressure, sore throat, headaches, decreased appetite, non bloody diarrhea, dry cough, and chest discomfort only with coughing, otherwise none. Headaches are similar to previous, gradual onset, not maximal at onset. Rates overall discomfort an 8/10 in severity. Patient has not tried any interventions at home. No specific alleviating/aggravating factors. Denies dyspnea, abdominal pain, or wheezing. No recent sick contacts. Patient did not receive flu vaccine this year due to allergy.  HPI  Past Medical History:  Diagnosis Date  . GERD (gastroesophageal reflux disease)   . Migraine     Patient Active Problem List   Diagnosis Date Noted  . Hepatic steatosis 07/14/2016  . GERD (gastroesophageal reflux disease) 07/14/2016  . Dyspepsia 07/14/2016  . Abdominal pain 07/14/2016    Past Surgical History:  Procedure Laterality Date  . CYST EXCISION    . TUBAL LIGATION      OB History    No data available       Home Medications    Prior to Admission medications   Medication Sig Start Date End Date Taking? Authorizing Provider  acetaminophen (TYLENOL) 500 MG tablet Take 1,000 mg by mouth 2 (two) times daily as needed for pain.    [provider]  cyclobenzaprine (FLEXERIL) 10 MG tablet Take 1 tablet (10 mg total) by mouth 2 (two) times daily as needed for muscle spasms. 01/25/17   Khatri, Hina, PA-C  pantoprazole (PROTONIX) 40 MG tablet Take 1 tablet (40 mg total) by mouth 2 (two) times daily before a meal. 07/14/16    Anice Paganini, NP  sucralfate (CARAFATE) 1 GM/10ML suspension Take 10 mLs (1 g total) by mouth 4 (four) times daily. 07/14/16   Anice Paganini, NP    Family History Family History  Problem Relation Age of Onset  . Colon cancer Father 83       Passed away from colon CA    Social History Social History   Tobacco Use  . Smoking status: Former Smoker    Packs/day: 0.50    Types: Cigarettes    Last attempt to quit: 11/14/2015    Years since quitting: 1.6  . Smokeless tobacco: Never Used  Substance Use Topics  . Alcohol use: No  . Drug use: No     Allergies   Aspirin   Review of Systems Review of Systems  Constitutional: Positive for appetite change (decreased), chills, fatigue and fever (subjective).  HENT: Positive for congestion, ear pain (mild bilateral) and sore throat. Negative for rhinorrhea.   Eyes: Negative for visual disturbance.  Respiratory: Positive for cough. Negative for shortness of breath and wheezing.   Cardiovascular: Positive for chest pain (only with coughing). Negative for palpitations and leg swelling.  Gastrointestinal: Positive for diarrhea. Negative for abdominal pain, blood in stool and vomiting.  Musculoskeletal: Positive for myalgias (generalized).  Neurological: Positive for headaches. Negative for dizziness, syncope, weakness and numbness.  All other systems reviewed and are negative.   Physical Exam Updated Vital Signs BP (!) 147/70 (BP Location: Right Arm)  Pulse 80   Temp 98.6 F (37 C) (Oral)   Resp 18   LMP 06/30/2017   SpO2 100%   Physical Exam  Constitutional: She appears well-developed and well-nourished.  Non-toxic appearance. No distress.  HENT:  Head: Normocephalic and atraumatic.  Right Ear: Tympanic membrane is not perforated, not erythematous, not retracted and not bulging.  Left Ear: Tympanic membrane is not perforated, not erythematous, not retracted and not bulging.  Nose: Mucosal edema present. Right sinus exhibits no  maxillary sinus tenderness and no frontal sinus tenderness. Left sinus exhibits no maxillary sinus tenderness and no frontal sinus tenderness.  Mouth/Throat: Uvula is midline and oropharynx is clear and moist. No oropharyngeal exudate or posterior oropharyngeal erythema.  Eyes: Conjunctivae and EOM are normal. Pupils are equal, round, and reactive to light. Right eye exhibits no discharge. Left eye exhibits no discharge.  Neck: Normal range of motion. Neck supple.  Cardiovascular: Normal rate and regular rhythm.  No murmur heard. Pulmonary/Chest: Effort normal and breath sounds normal. No respiratory distress. She has no wheezes. She has no rhonchi. She has no rales.  Abdominal: Soft. She exhibits no distension. There is no tenderness. There is no rigidity, no rebound and no guarding.  Lymphadenopathy:    She has no cervical adenopathy.  Neurological:  Alert. Clear speech.. CNIII-XII are intact. Bilateral upper and lower extremities' sensation intact to sharp and dull touch. 5/5 grip strength bilaterally. Gait is intact.   Skin: Skin is warm and dry. No rash noted.  Psychiatric: She has a normal mood and affect. Her behavior is normal.  Nursing note and vitals reviewed.   ED Treatments / Results  Labs (all labs ordered are listed, but only abnormal results are displayed) Labs Reviewed - No data to display  EKG  EKG Interpretation None       Radiology Dg Chest 2 View  Result Date: 06/30/2017 CLINICAL DATA:  Productive cough.  Congestion. EXAM: CHEST  2 VIEW COMPARISON:  02/07/2017. FINDINGS: Mediastinum and hilar structures normal. Lungs are clear. Heart size normal. Stable left base pleural thickening most likely scarring. No pneumothorax. IMPRESSION: Stable left base mild pleural thickening, most likely scarring. No acute pulmonary infiltrate. Electronically Signed   By: Maisie Fus  Register   On: 06/30/2017 13:27    Procedures Procedures (including critical care time)  Medications  Ordered in ED Medications - No data to display   Initial Impression / Assessment and Plan / ED Course  I have reviewed the triage vital signs and the nursing notes.  Pertinent labs & imaging results that were available during my care of the patient were reviewed by me and considered in my medical decision making (see chart for details).    Patient presents with symptoms consistent with influenza like illness. She is nontoxic appearing, vitals are WNL. Patient is afebrile and without adventitious sounds on lung exam, CXR negative for infiltrate, doubt PNA. No wheezing on exam. Afebrile, no sinus tenderness, doubt sinusitis. Centor score 0, doubt strep pharyngitis. Abdomen is non-tender. Suspect viral etiology, will treat supportively with Ibuprofen, Flonase, and Tessalon. I discussed results, treatment plan, need for PCP follow-up, and return precautions with the patient. Provided opportunity for questions, patient confirmed understanding and is in agreement with plan.    Final Clinical Impressions(s) / ED Diagnoses   Final diagnoses:  Influenza-like illness    ED Discharge Orders        Ordered    fluticasone (FLONASE) 50 MCG/ACT nasal spray  Daily  06/30/17 1347    benzonatate (TESSALON) 100 MG capsule  Every 8 hours     06/30/17 1347    ibuprofen (ADVIL,MOTRIN) 800 MG tablet  Every 8 hours PRN     06/30/17 1347       Cherly Andersonetrucelli, Lylian Sanagustin R, PA-C 06/30/17 1348    LongArlyss Repress, Joshua G, MD 06/30/17 1944

## 2017-06-30 NOTE — Discharge Instructions (Signed)
You were seen in the emergency today and diagnosed with an influenza like illness.  I have prescribed you multiple medications to treat your symptoms.   -Flonase to be used 1 spray in each nostril daily.  This medication is used to treat your congestion.  -Tessalon can be taken once every 8 hours as needed.  This medication is used to treat your cough.  -Ibuprofen to be taken once every 8 hours as needed for pain.  You will need to follow-up with your primary care provider in 3-5 days if your symptoms have not improved.  If you do not have a primary care provider one is provided in your discharge instructions.  Return to the emergency department for any new or worsening symptoms including but not limited to persistent fever for 5 days, difficulty breathing, chest pain, or passing out.

## 2018-01-28 IMAGING — CT CT ABD-PELV W/ CM
2 of 4 series · 16 of 46 positions shown, 18 images · IV contrast (iopamidol)
Comparison: 12/03/2011

CLINICAL DATA: Mid to low abdominal pain for 1 week diarrhea and
nausea

EXAM:
CT ABDOMEN AND PELVIS WITH CONTRAST
TECHNIQUE: Multidetector CT imaging of the abdomen and pelvis was performed
using the standard protocol following bolus administration of
intravenous contrast.
CONTRAST:  100mL 8LXC7A-IKK IOPAMIDOL ([id number]-IKK) INJECTION 61%

[Series 2: axial st · axial · 0.98mm/px · z∈[-493,-28]mm · 13 of 103 slices shown, 15 images]
[im 5/103  soft-tissue]
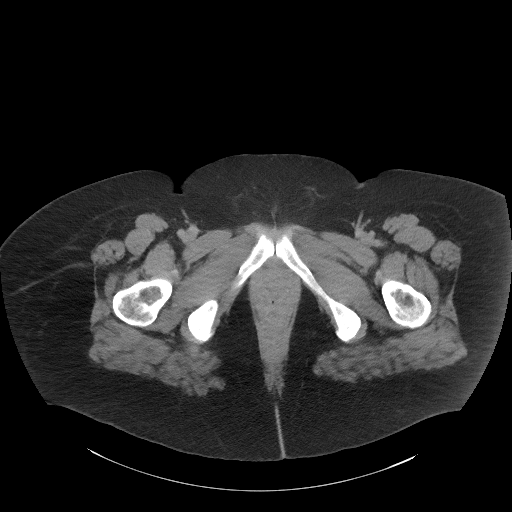
[im 5/103  bone]
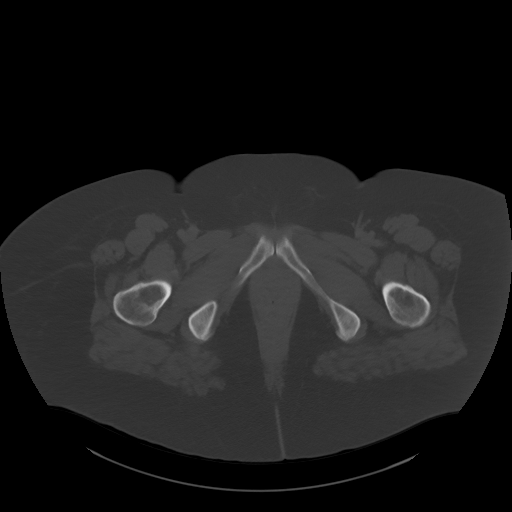
[im 13/103  soft-tissue]
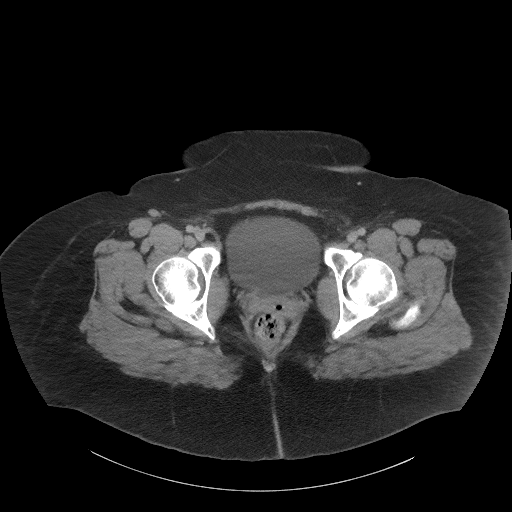
[im 22/103  soft-tissue]
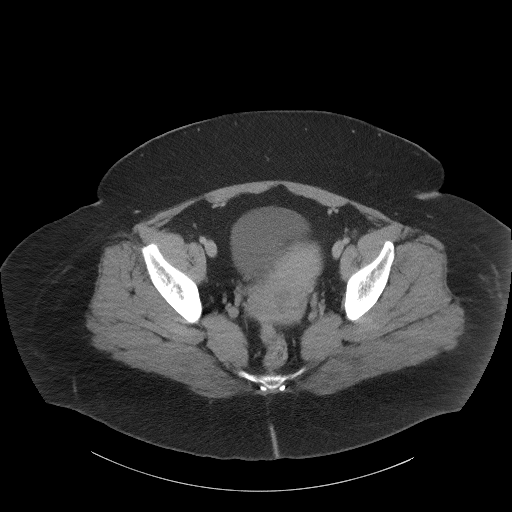
[im 30/103  soft-tissue]
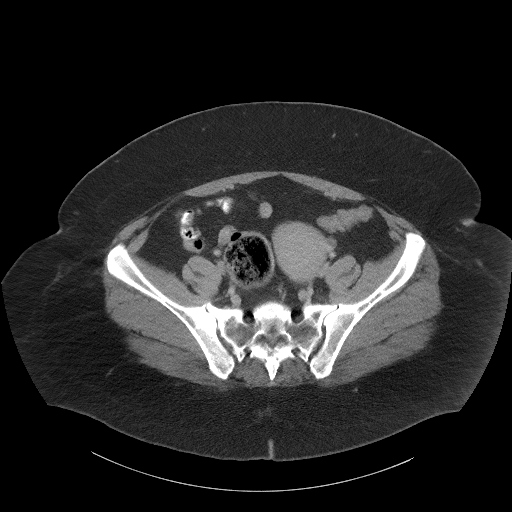
[im 35/103  soft-tissue]
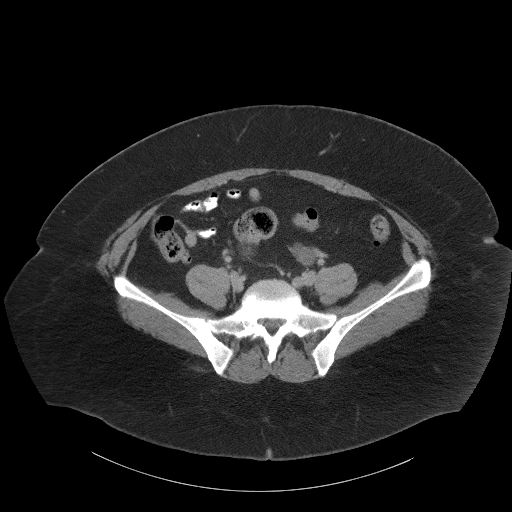
[im 43/103  soft-tissue]
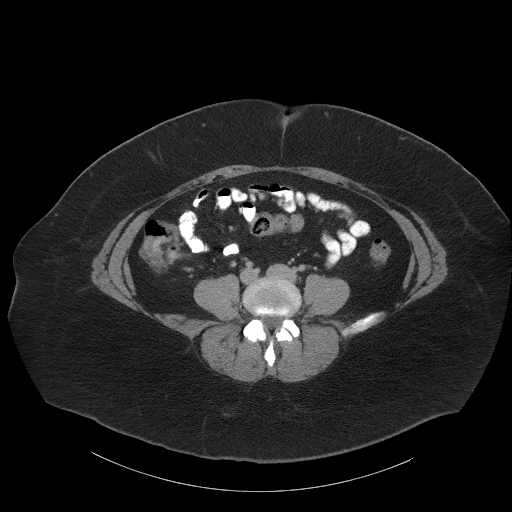
[im 52/103  soft-tissue]
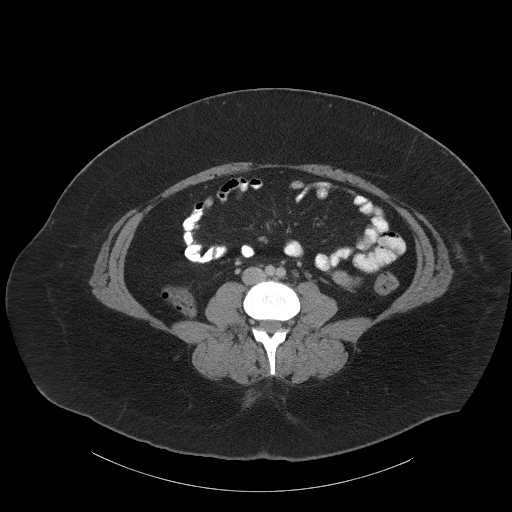
[im 60/103  soft-tissue]
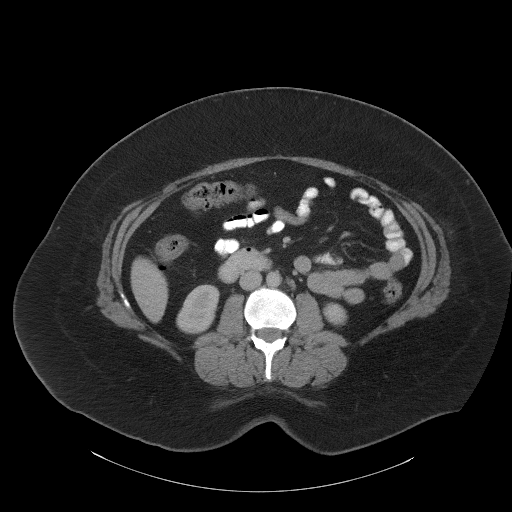
[im 69/103  soft-tissue]
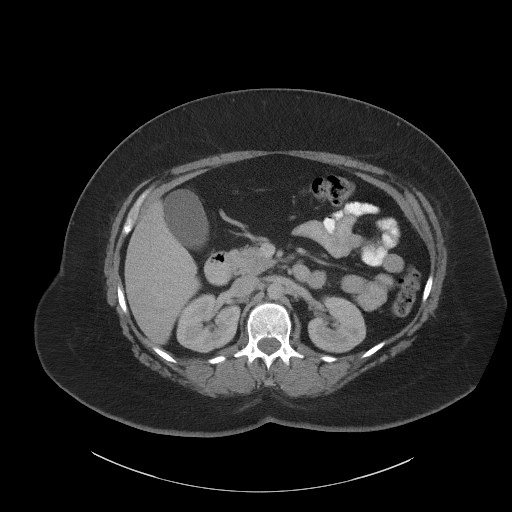
[im 69/103  bone]
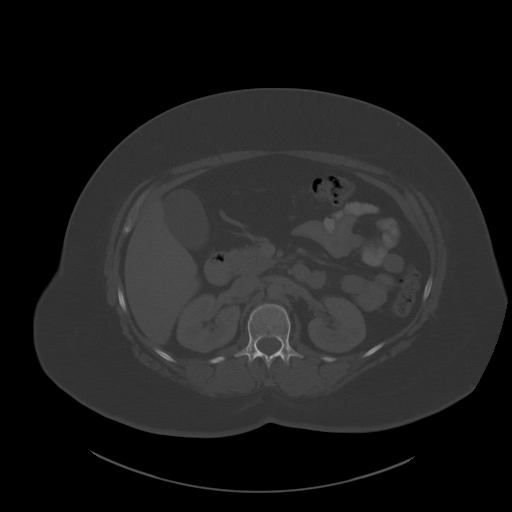
[im 73/103  soft-tissue]
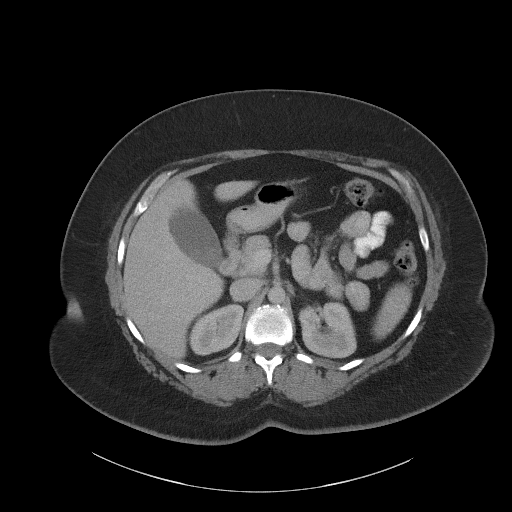
[im 81/103  soft-tissue]
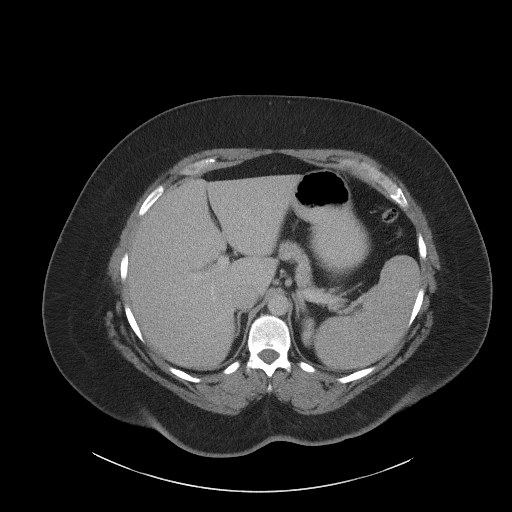
[im 90/103  soft-tissue]
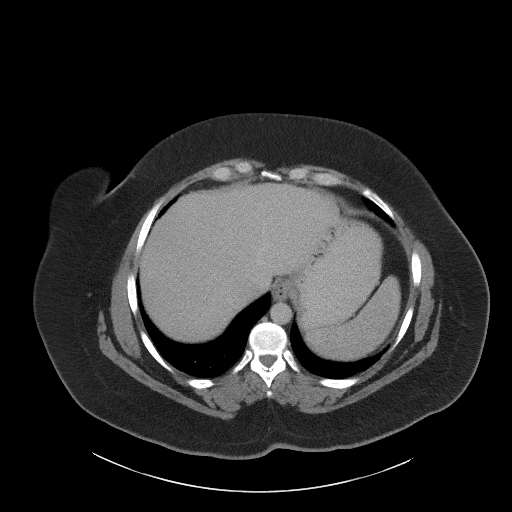
[im 98/103  soft-tissue]
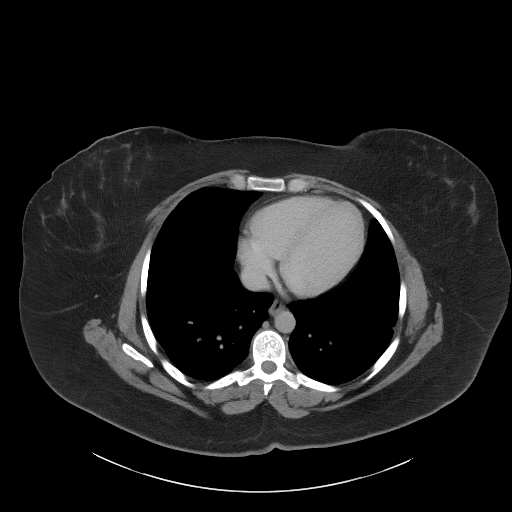

[Series 4: coronal st · coronal · 0.88mm/px · 3 of 111 slices shown]
[im 37/111  soft-tissue]
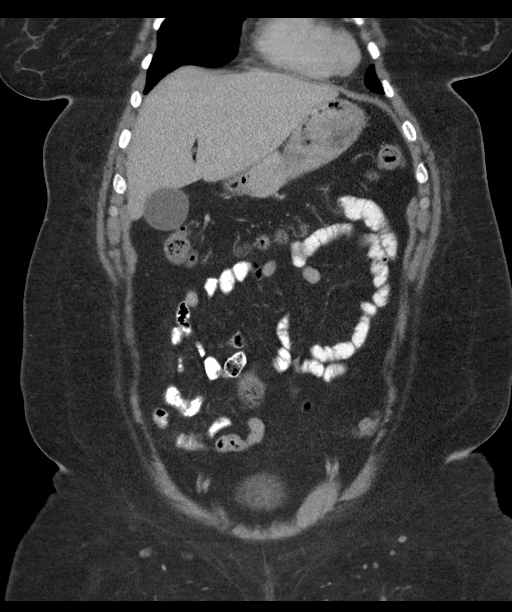
[im 49/111  soft-tissue]
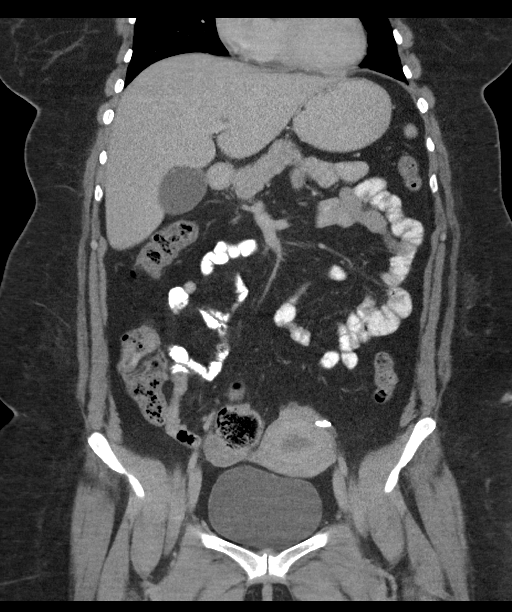
[im 62/111  soft-tissue]
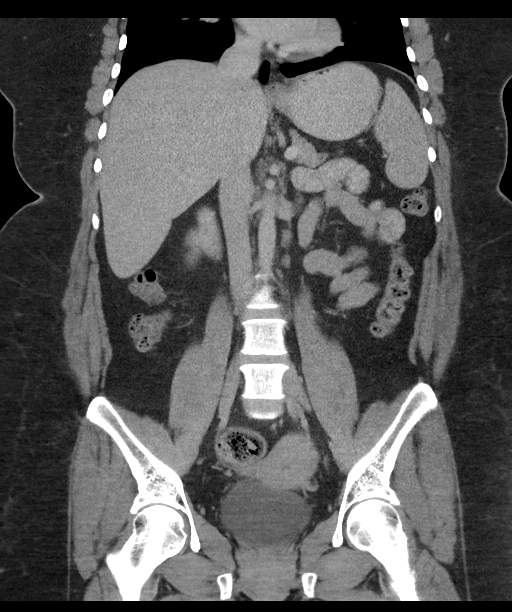

[16 of 46 positions shown; findings below may reference images not displayed]

FINDINGS: Lower chest: Visualized lung bases demonstrate no acute
consolidation or effusion. The heart size is normal. dilatation.

Hepatobiliary: Slightly enlarged liver measuring 19 cm. Mild diffuse
decreased density suggests mild fatty infiltration. No focal hepatic
abnormality. Possible mild increased density within the gallbladder
lumen but without calcified stone. No wall thickening. No biliary

Pancreas: Unremarkable. No pancreatic ductal dilatation or
surrounding inflammatory changes.

Spleen: Normal in size without focal abnormality. Accessory splenule
anteriorly.

Adrenals/Urinary Tract: 3 mm stone lower pole left kidney. No
hydronephrosis. Bladder normal.

Stomach/Bowel: Stomach is within normal limits. Appendix appears
normal. No evidence of bowel wall thickening, distention, or
inflammatory changes.

Vascular/Lymphatic: No significant vascular findings are present. No
enlarged abdominal or pelvic lymph nodes. Nonspecific subcentimeter
retroperitoneal lymph nodes

Reproductive: Bilateral tubal ligation clips.  No adnexal masses.

Other: No abdominal wall hernia or abnormality. No abdominopelvic
ascites.

Musculoskeletal: No acute or significant osseous findings.
IMPRESSION: 1. No CT evidence for acute intra-abdominal or pelvic pathology
2. Slightly enlarged liver with mild steatosis
3. Possible mild increased density in the gallbladder lumen,
possible sludge. Could correlate with targeted ultrasound if
clinically suspect gallbladder disease.
4. Nonobstructing stone in the left kidney.

## 2018-10-03 IMAGING — US US ABDOMEN LIMITED
1 series · 14 of 25 positions shown · non-contrast
Comparison: None.

CLINICAL DATA: Postprandial right upper quadrant pain

EXAM:
US ABDOMEN LIMITED - RIGHT UPPER QUADRANT

[Series 1: us abdomen limited · 0.23mm/px · 14 of 75 slices shown]
[im 1/75]
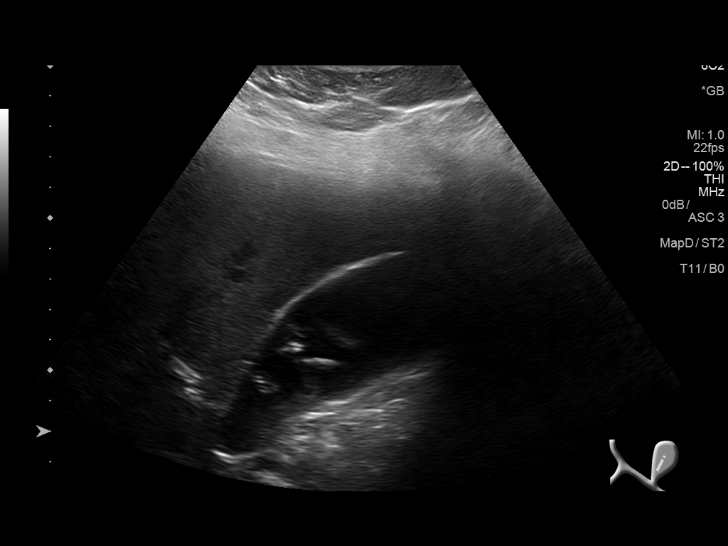
[im 7/75]
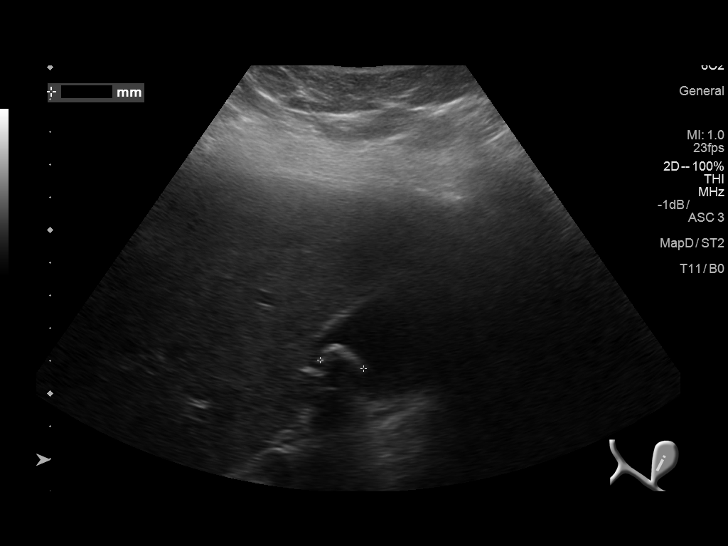
[im 13/75]
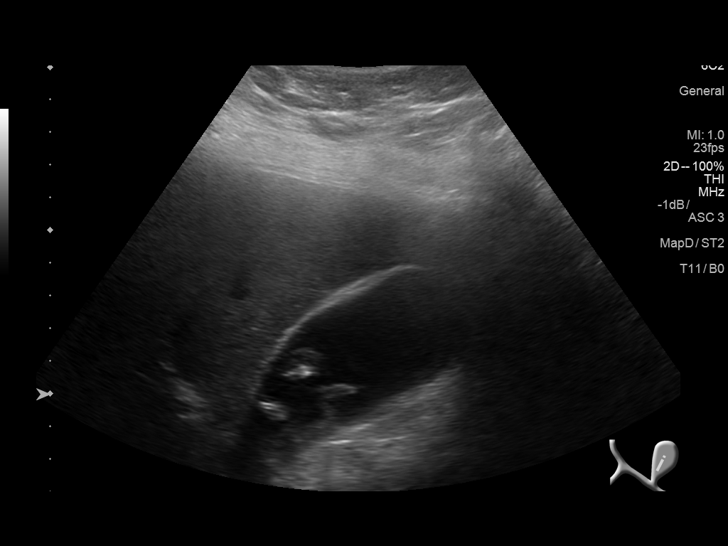
[im 19/75]
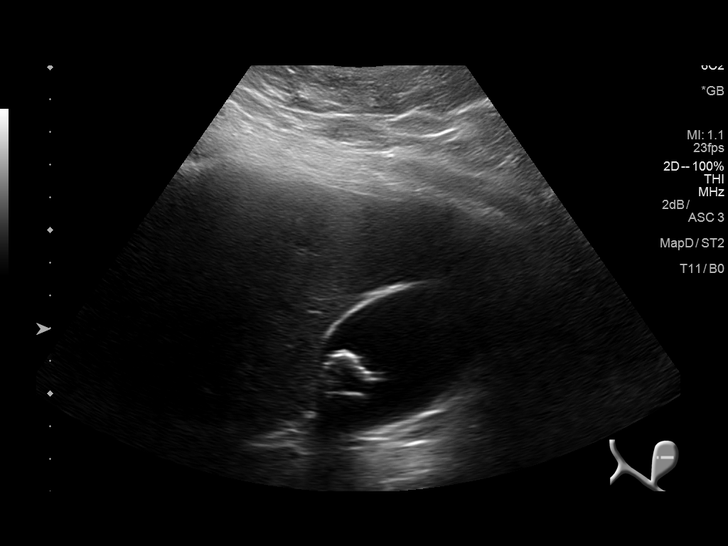
[im 25/75]
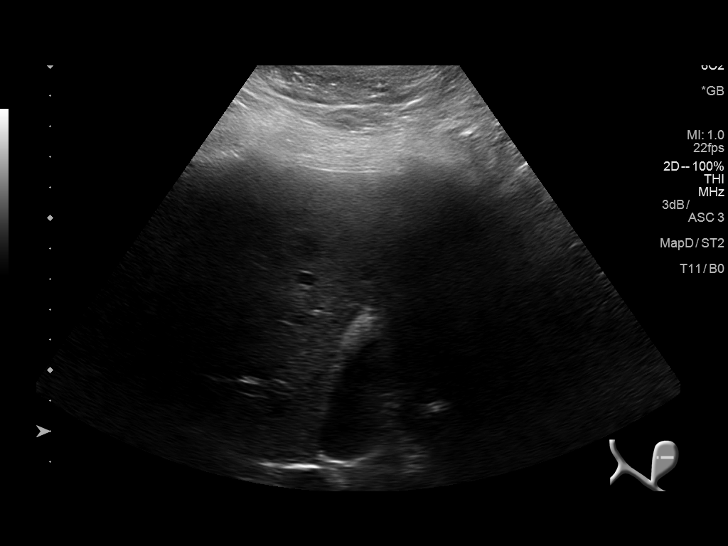
[im 28/75]
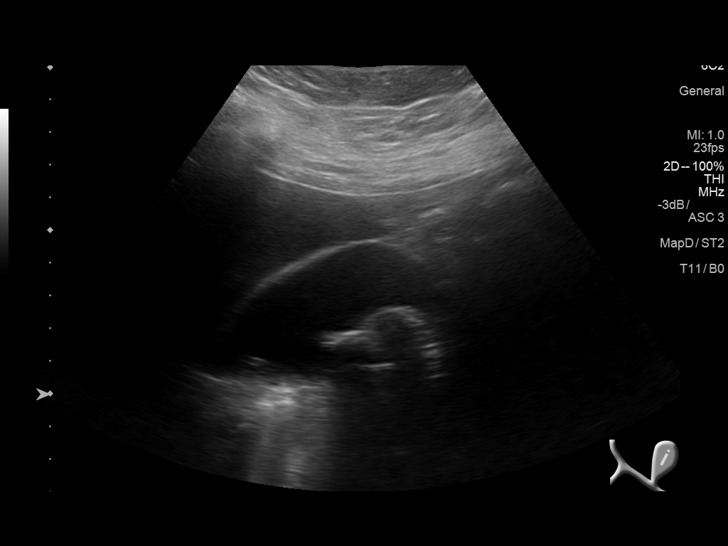
[im 34/75]
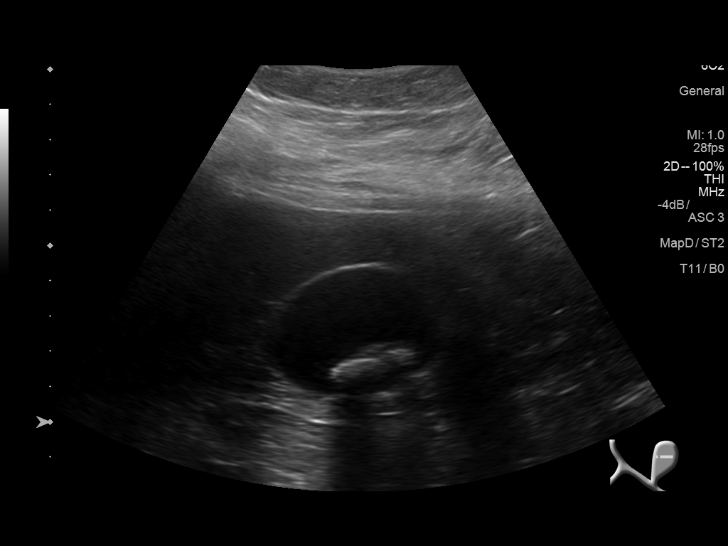
[im 41/75]
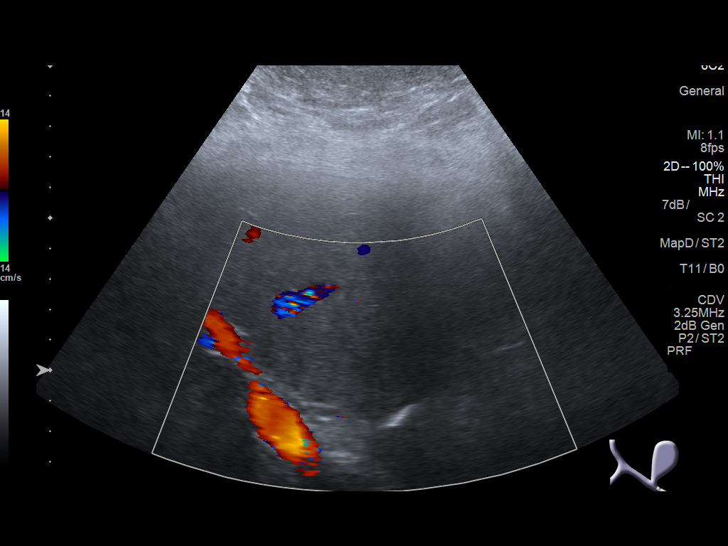
[im 47/75]
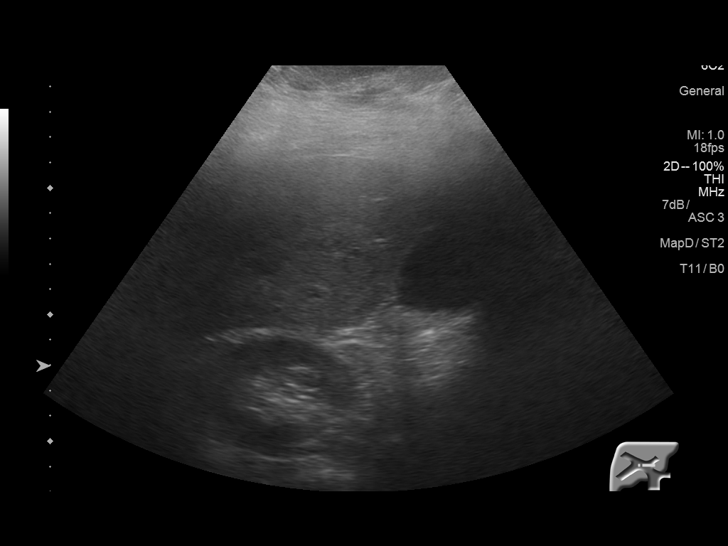
[im 50/75]
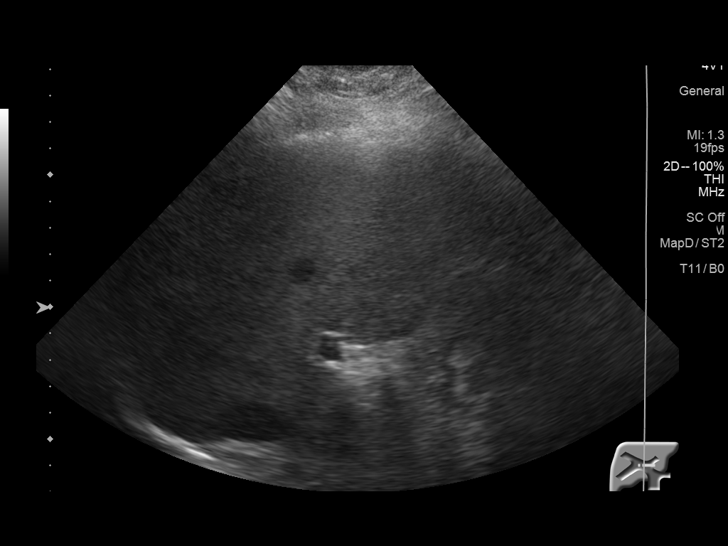
[im 56/75]
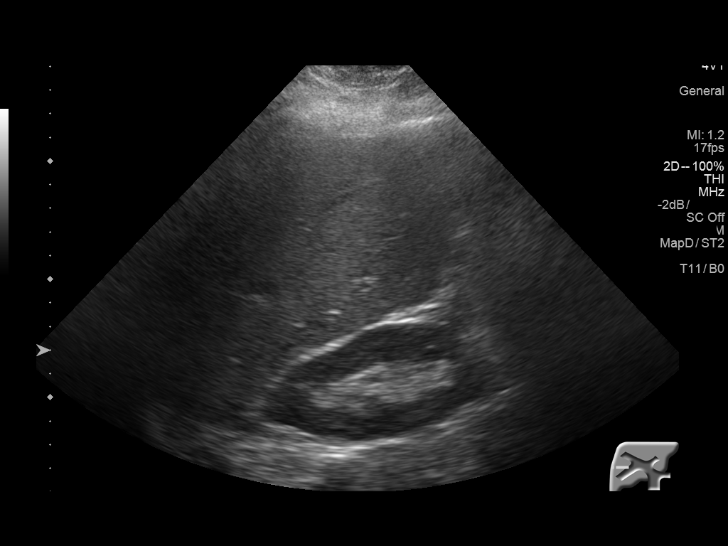
[im 62/75]
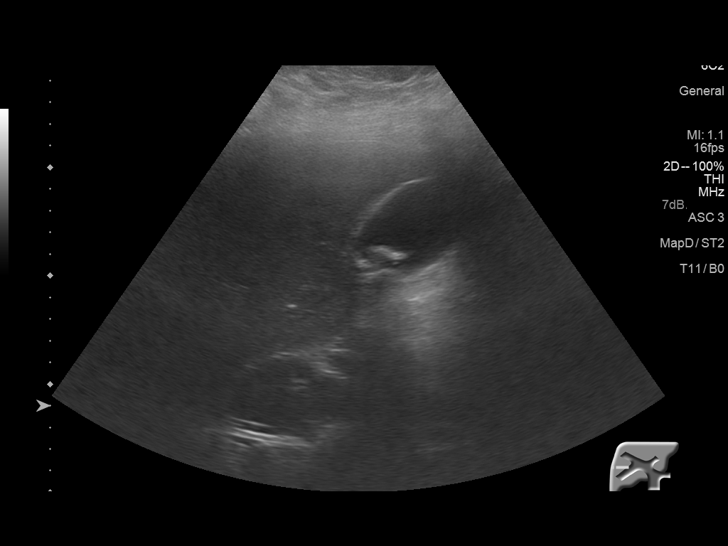
[im 68/75]
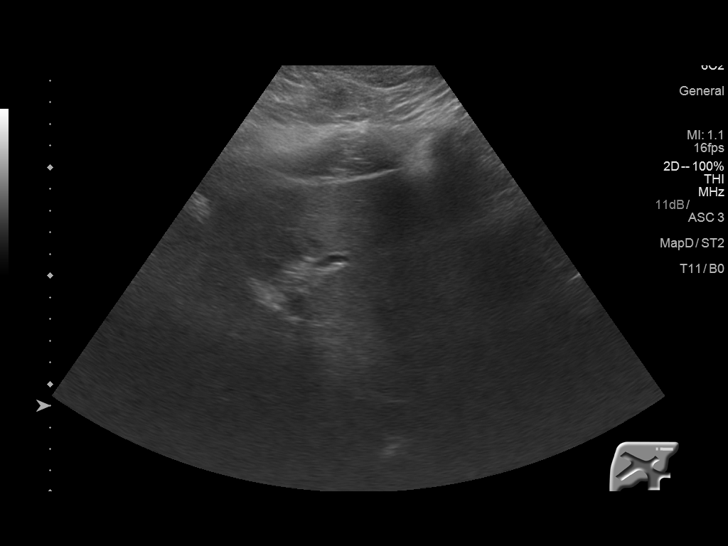
[im 75/75]
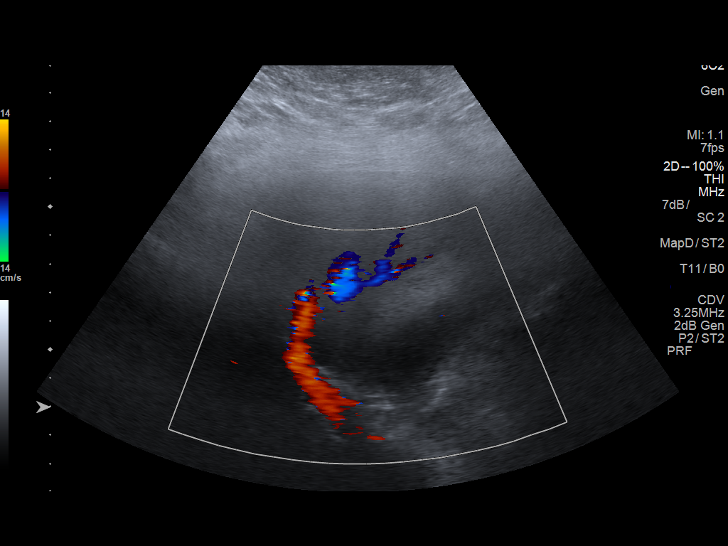

[14 of 25 positions shown; findings below may reference images not displayed]

FINDINGS: Gallbladder:

Cholelithiasis without pericholecystic fluid. Top normal gallbladder
wall thickness measuring 3.6 mm. Largest calculus measures 16 mm.

Common bile duct:

Diameter: 3.8 mm

Liver:

No focal lesion identified. Increased hepatic parenchymal
echogenicity.
IMPRESSION: 1. Cholelithiasis without sonographic evidence of acute
cholecystitis.
2. Increased hepatic echogenicity as can be seen with hepatic
steatosis.

## 2018-11-29 ENCOUNTER — Telehealth: Payer: Self-pay

## 2018-11-29 ENCOUNTER — Other Ambulatory Visit: Payer: Self-pay

## 2018-11-29 DIAGNOSIS — Z20822 Contact with and (suspected) exposure to covid-19: Secondary | ICD-10-CM

## 2018-11-29 NOTE — Telephone Encounter (Signed)
rec'd referral from [facility] To schedule pt. For COVID testing.  Phone call to pt.  Scheduled appt. today at 3:15 PM, @ [facility].  Advised to wear a mask and remain in car for testing.  Verb. Understanding.    

## 2018-12-02 LAB — NOVEL CORONAVIRUS, NAA: SARS-CoV-2, NAA: NOT DETECTED

## 2019-02-18 ENCOUNTER — Other Ambulatory Visit: Payer: Self-pay | Admitting: *Deleted

## 2019-02-18 DIAGNOSIS — Z20822 Contact with and (suspected) exposure to covid-19: Secondary | ICD-10-CM

## 2019-02-20 LAB — NOVEL CORONAVIRUS, NAA: SARS-CoV-2, NAA: NOT DETECTED

## 2019-05-19 ENCOUNTER — Ambulatory Visit: Payer: HRSA Program | Attending: Internal Medicine

## 2019-05-19 ENCOUNTER — Other Ambulatory Visit: Payer: Self-pay

## 2019-05-19 DIAGNOSIS — U071 COVID-19: Secondary | ICD-10-CM | POA: Diagnosis not present

## 2019-05-19 DIAGNOSIS — Z20828 Contact with and (suspected) exposure to other viral communicable diseases: Secondary | ICD-10-CM | POA: Diagnosis present

## 2019-05-19 DIAGNOSIS — Z20822 Contact with and (suspected) exposure to covid-19: Secondary | ICD-10-CM

## 2019-05-20 NOTE — Progress Notes (Signed)
Order(s) created erroneously. Erroneous order ID: [id number]  Order moved by: Suman Trivedi M  Order move date/time: 05/20/2019 3:24 PM  Source Patient: [id number]  Source Contact: 05/19/2019  Destination Patient: [id number]  Destination Contact: 05/19/2019 

## 2019-05-20 NOTE — Progress Notes (Signed)
Order(s) created erroneously. Erroneous order ID: [id number]  Order moved by: Brigitte Pulse  Order move date/time: 05/20/2019 3:24 PM  Source Patient: [id number]  Source Contact: 05/19/2019  Destination Patient: [id number]  Destination Contact: 05/19/2019

## 2019-05-20 NOTE — Progress Notes (Signed)
Orders moved to this encounter.  

## 2019-05-21 LAB — NOVEL CORONAVIRUS, NAA: SARS-CoV-2, NAA: DETECTED — AB

## 2019-05-22 ENCOUNTER — Telehealth: Payer: Self-pay | Admitting: Infectious Diseases

## 2019-05-22 NOTE — Telephone Encounter (Signed)
Called to discuss with patient about Covid symptoms and the use of bamlanivimab, a monoclonal antibody infusion for those with mild to moderate Covid symptoms and at a high risk of hospitalization.  Pt is qualified for this infusion at the [facility] infusion center due to BMI>35   Symptom duration: unknown  Last Eligible Day to Receive: unknown    MyChart sent to call back

## 2019-05-31 ENCOUNTER — Ambulatory Visit: Payer: Self-pay | Attending: Internal Medicine

## 2019-09-27 ENCOUNTER — Other Ambulatory Visit: Payer: Self-pay

## 2019-09-27 ENCOUNTER — Ambulatory Visit
Admission: EM | Admit: 2019-09-27 | Discharge: 2019-09-27 | Disposition: A | Discharge status: Home / Self Care | Payer: Self-pay | Attending: Emergency Medicine | Admitting: Emergency Medicine

## 2019-09-27 DIAGNOSIS — R3915 Urgency of urination: Secondary | ICD-10-CM | POA: Insufficient documentation

## 2019-09-27 DIAGNOSIS — R300 Dysuria: Secondary | ICD-10-CM | POA: Insufficient documentation

## 2019-09-27 DIAGNOSIS — R350 Frequency of micturition: Secondary | ICD-10-CM | POA: Insufficient documentation

## 2019-09-27 LAB — POCT URINALYSIS DIP (MANUAL ENTRY)
Bilirubin, UA: NEGATIVE
Blood, UA: NEGATIVE
Glucose, UA: NEGATIVE mg/dL
Ketones, POC UA: NEGATIVE mg/dL
Nitrite, UA: NEGATIVE
Protein Ur, POC: NEGATIVE mg/dL
Spec Grav, UA: >=1.03 — AB (ref 1.010–1.025)
Urobilinogen, UA: 0.2 E.U./dL
pH, UA: 6 (ref 5.0–8.0)

## 2019-09-27 LAB — POCT URINE PREGNANCY: Preg Test, Ur: NEGATIVE

## 2019-09-27 MED ORDER — NITROFURANTOIN MONOHYD MACRO 100 MG PO CAPS: 100.0000 mg | ORAL_CAPSULE | Freq: Two times a day (BID) | ORAL | 0 refills | Status: AC

## 2019-09-27 MED ORDER — PHENAZOPYRIDINE HCL 200 MG PO TABS: 200.0000 mg | ORAL_TABLET | Freq: Three times a day (TID) | ORAL | 0 refills | Status: AC

## 2019-09-27 NOTE — Discharge Instructions (Signed)
Urine concerning for UTI Urine culture sent.  We will call you with abnormal results.   Push fluids and get plenty of rest.   Take antibiotic as directed and to completion Take pyridium as prescribed and as needed for symptomatic relief Follow up with PCP if symptoms persists Return here or go to ER if you have any new or worsening symptoms such as fever, worsening abdominal pain, nausea/vomiting, flank pain, etc... 

## 2019-09-27 NOTE — ED Provider Notes (Signed)
[facility]   CC: Burning with urination  SUBJECTIVE:  Kirsten Myers is a 39 y.o. female who complains of urinary frequency, urgency and dysuria for the past 1 week.  Patient denies a precipitating event, recent sexual encounter, excessive caffeine intake.  Complains of lower abdominal pressure. Has NOT tried OTC medications.  Symptoms are made worse with urination.  Admits to similar symptoms in the past.  Denies fever, chills, nausea, vomiting, abdominal pain, flank pain, abnormal vaginal discharge or bleeding, hematuria.    LMP: Patient's last menstrual period was 09/01/2019.  ROS: As in HPI.  All other pertinent ROS negative.     Past Medical History:  Diagnosis Date  . GERD (gastroesophageal reflux disease)   . Migraine    Past Surgical History:  Procedure Laterality Date  . CYST EXCISION    . TUBAL LIGATION     Allergies  Allergen Reactions  . Aspirin Rash and Other (See Comments)    Stomach pain   No current facility-administered medications on file prior to encounter.   Current Outpatient Medications on File Prior to Encounter  Medication Sig Dispense Refill  . [DISCONTINUED] fluticasone (FLONASE) 50 MCG/ACT nasal spray Place 1 spray into both nostrils daily. 16 g 2  . [DISCONTINUED] pantoprazole (PROTONIX) 40 MG tablet Take 1 tablet (40 mg total) by mouth 2 (two) times daily before a meal. 60 tablet 3  . [DISCONTINUED] sucralfate (CARAFATE) 1 GM/10ML suspension Take 10 mLs (1 g total) by mouth 4 (four) times daily. 420 mL 1   Social History   Socioeconomic History  . Marital status: Married    Spouse name: Not on file  . Number of children: Not on file  . Years of education: Not on file  . Highest education level: Not on file  Occupational History  . Not on file  Tobacco Use  . Smoking status: Former Smoker    Packs/day: 0.50    Types: Cigarettes    Quit date: 11/14/2015    Years since quitting: 3.8  . Smokeless tobacco: Never Used   Substance and Sexual Activity  . Alcohol use: No  . Drug use: No  . Sexual activity: Yes    Birth control/protection: Surgical  Other Topics Concern  . Not on file  Social History Narrative  . Not on file   Social Determinants of Health   Financial Resource Strain:   . Difficulty of Paying Living Expenses:   Food Insecurity:   . Worried About Charity fundraiser in the Last Year:   . Arboriculturist in the Last Year:   Transportation Needs:   . Film/video editor (Medical):   Marland Kitchen Lack of Transportation (Non-Medical):   Physical Activity:   . Days of Exercise per Week:   . Minutes of Exercise per Session:   Stress:   . Feeling of Stress :   Social Connections:   . Frequency of Communication with Friends and Family:   . Frequency of Social Gatherings with Friends and Family:   . Attends Religious Services:   . Active Member of Clubs or Organizations:   . Attends Archivist Meetings:   Marland Kitchen Marital Status:   Intimate Partner Violence:   . Fear of Current or Ex-Partner:   . Emotionally Abused:   Marland Kitchen Physically Abused:   . Sexually Abused:    Family History  Problem Relation Age of Onset  . Colon cancer Father 24       Passed away  from colon CA    OBJECTIVE:  Vitals:   09/27/19 1248  BP: 137/84  Pulse: 70  Resp: 18  Temp: 98.6 F (37 C)  TempSrc: Oral  SpO2: 97%  Weight: (!) 305 lb (138.3 kg)  Height: 5\' 8"  (1.727 m)   General appearance: Alert in no acute distress HEENT: NCAT.  Oropharynx clear.  Lungs: clear to auscultation bilaterally without adventitious breath sounds Heart: regular rate and rhythm.   Abdomen: soft; non-distended; no tenderness; bowel sounds present; no guarding Back: no CVA tenderness Extremities: no edema; symmetrical with no gross deformities Skin: warm and dry Neurologic: Ambulates from chair to exam table without difficulty Psychological: alert and cooperative; normal mood and affect  Labs Reviewed  POCT URINALYSIS DIP  (MANUAL ENTRY) - Abnormal; Notable for the following components:      Result Value   Clarity, UA cloudy (*)    Spec Grav, UA >=1.030 (*)    Leukocytes, UA Small (1+) (*)    All other components within normal limits  URINE CULTURE  POCT URINE PREGNANCY    ASSESSMENT & PLAN:  1. Dysuria   2. Urinary frequency   3. Urinary urgency     Meds ordered this encounter  Medications  . nitrofurantoin, macrocrystal-monohydrate, (MACROBID) 100 MG capsule    Sig: Take 1 capsule (100 mg total) by mouth 2 (two) times daily.    Dispense:  10 capsule    Refill:  0    Order Specific Question:   Supervising Provider    Answer:   Eustace Moore  . phenazopyridine (PYRIDIUM) 200 MG tablet    Sig: Take 1 tablet (200 mg total) by mouth 3 (three) times daily.    Dispense:  6 tablet    Refill:  0    Order Specific Question:   Supervising Provider    Answer:   [[id number]] Eustace Moore   Urine concerning for UTI Urine culture sent.  We will call you with abnormal results.   Push fluids and get plenty of rest.   Take antibiotic as directed and to completion Take pyridium as prescribed and as needed for symptomatic relief Follow up with PCP if symptoms persists Return here or go to ER if you have any new or worsening symptoms such as fever, worsening abdominal pain, nausea/vomiting, flank pain, etc...  Outlined signs and symptoms indicating need for more acute intervention. Patient verbalized understanding. After Visit Summary given.     [[id number]], PA-C 09/27/19 1320

## 2019-09-27 NOTE — ED Triage Notes (Signed)
Pt c/o burning with urination, urinary frequency, and pelvic pain x 1 week.

## 2019-09-28 LAB — URINE CULTURE: Culture: <10000 — AB

## 2020-02-14 ENCOUNTER — Ambulatory Visit: Payer: Self-pay

## 2020-11-07 ENCOUNTER — Other Ambulatory Visit (HOSPITAL_COMMUNITY): Payer: Self-pay | Admitting: Family Medicine

## 2020-11-07 DIAGNOSIS — Z1231 Encounter for screening mammogram for malignant neoplasm of breast: Secondary | ICD-10-CM

## 2020-11-23 ENCOUNTER — Encounter (HOSPITAL_COMMUNITY): Payer: Self-pay

## 2020-11-23 ENCOUNTER — Ambulatory Visit (HOSPITAL_COMMUNITY): Payer: Self-pay

## 2020-12-10 ENCOUNTER — Ambulatory Visit (HOSPITAL_COMMUNITY): Payer: Self-pay

## 2020-12-10 ENCOUNTER — Encounter (HOSPITAL_COMMUNITY): Payer: Self-pay

## 2021-10-22 DIAGNOSIS — I10 Essential (primary) hypertension: Secondary | ICD-10-CM | POA: Diagnosis not present

## 2021-10-22 DIAGNOSIS — Z13220 Encounter for screening for lipoid disorders: Secondary | ICD-10-CM | POA: Diagnosis not present

## 2021-10-22 DIAGNOSIS — E559 Vitamin D deficiency, unspecified: Secondary | ICD-10-CM | POA: Diagnosis not present

## 2021-10-25 ENCOUNTER — Other Ambulatory Visit (HOSPITAL_COMMUNITY): Payer: Self-pay | Admitting: Family Medicine

## 2021-10-25 DIAGNOSIS — Z1231 Encounter for screening mammogram for malignant neoplasm of breast: Secondary | ICD-10-CM

## 2022-02-28 DIAGNOSIS — R110 Nausea: Secondary | ICD-10-CM | POA: Diagnosis not present

## 2022-02-28 DIAGNOSIS — I10 Essential (primary) hypertension: Secondary | ICD-10-CM | POA: Diagnosis not present

## 2022-02-28 DIAGNOSIS — R350 Frequency of micturition: Secondary | ICD-10-CM | POA: Diagnosis not present

## 2022-02-28 DIAGNOSIS — R519 Headache, unspecified: Secondary | ICD-10-CM | POA: Diagnosis not present

## 2022-03-04 DIAGNOSIS — Z7182 Exercise counseling: Secondary | ICD-10-CM | POA: Diagnosis not present

## 2022-03-04 DIAGNOSIS — Z6841 Body Mass Index (BMI) 40.0 and over, adult: Secondary | ICD-10-CM | POA: Diagnosis not present

## 2022-03-04 DIAGNOSIS — Z713 Dietary counseling and surveillance: Secondary | ICD-10-CM | POA: Diagnosis not present

## 2022-03-04 DIAGNOSIS — K219 Gastro-esophageal reflux disease without esophagitis: Secondary | ICD-10-CM | POA: Diagnosis not present

## 2022-03-24 ENCOUNTER — Ambulatory Visit
Admission: EM | Admit: 2022-03-24 | Discharge: 2022-03-24 | Disposition: A | Discharge status: Home / Self Care | Payer: 59 | Attending: Nurse Practitioner | Admitting: Nurse Practitioner

## 2022-03-24 DIAGNOSIS — S29019A Strain of muscle and tendon of unspecified wall of thorax, initial encounter: Secondary | ICD-10-CM

## 2022-03-24 MED ORDER — KETOROLAC TROMETHAMINE 30 MG/ML IJ SOLN
30.0000 mg | Freq: Once | INTRAMUSCULAR | Status: AC
  Administered 2022-03-24: 30 mg via INTRAMUSCULAR

## 2022-03-24 MED ORDER — DEXAMETHASONE SODIUM PHOSPHATE 10 MG/ML IJ SOLN
10.0000 mg | INTRAMUSCULAR | Status: AC
  Administered 2022-03-24: 10 mg via INTRAMUSCULAR

## 2022-03-24 MED ORDER — METHOCARBAMOL 500 MG PO TABS: 500.0000 mg | ORAL_TABLET | Freq: Two times a day (BID) | ORAL | 0 refills | Status: AC

## 2022-03-24 NOTE — Discharge Instructions (Addendum)
Take medication as prescribed. Try to remain as active as possible. Apply ice or heat. Apply ice for pain or swelling, heat for spasm or stiffness. Apply for 20 minutes, remove for 1 hour then repeat. Gentle stretching exercises while symptoms persist. Go to the emergency department if you develop loss of bowel/bladder function, numbness or tingling in your hand or arms, or weakness in your legs or upper extremities. Follow up with your PCP if symptoms do not improve.

## 2022-03-24 NOTE — ED Triage Notes (Signed)
Pt presents with complaints of right middle back pain since Thursday. 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

## 2022-03-24 NOTE — ED Provider Notes (Signed)
[facility]    CSN: [id number] Arrival date & time: 03/24/22  1756      History   Chief Complaint Chief Complaint  Patient presents with   Back Pain    I injured my back taking care of my grandma she is bed bound and I have do a lot of pushing and pulling to change her. - Entered by patient    HPI Kirsten Myers is a 41 y.o. female.   The history is provided by the patient.   Patient presents for complaints of mid back pain that occurred over the past several days.  She states that she was moving her grandmother who is bedbound and while she was doing so, she felt a pull in the right mid back.  She states pain worsens with certain movements such as bending, twisting, or turning.  She states that the pain does not radiate, and she has no loss of bowel or bladder function.  Reports she has been taking ibuprofen for her pain.  Patient states that she does have a history of low back pain.  Past Medical History:  Diagnosis Date   GERD (gastroesophageal reflux disease)    Migraine     Patient Active Problem List   Diagnosis Date Noted   Hepatic steatosis 07/14/2016   GERD (gastroesophageal reflux disease) 07/14/2016   Dyspepsia 07/14/2016   Abdominal pain 07/14/2016    Past Surgical History:  Procedure Laterality Date   CYST EXCISION     TUBAL LIGATION      OB History   No obstetric history on file.      Home Medications    Prior to Admission medications   Medication Sig Start Date End Date Taking? Authorizing Provider  methocarbamol (ROBAXIN) 500 MG tablet Take 1 tablet (500 mg total) by mouth 2 (two) times daily. 03/24/22  Yes Hanad Leino-Warren, Sadie Haber, NP  nitrofurantoin, macrocrystal-monohydrate, (MACROBID) 100 MG capsule Take 1 capsule (100 mg total) by mouth 2 (two) times daily. 09/27/19   Wurst, [place], PA-C  phenazopyridine (PYRIDIUM) 200 MG tablet Take 1 tablet (200 mg total) by mouth 3 (three) times daily. 09/27/19   Wurst, [place], PA-C   fluticasone (FLONASE) 50 MCG/ACT nasal spray Place 1 spray into both nostrils daily. 06/30/17 09/27/19  Petrucelli, Pleas Koch, PA-C  pantoprazole (PROTONIX) 40 MG tablet Take 1 tablet (40 mg total) by mouth 2 (two) times daily before a meal. 07/14/16 09/27/19  Anice Paganini, NP  sucralfate (CARAFATE) 1 GM/10ML suspension Take 10 mLs (1 g total) by mouth 4 (four) times daily. 07/14/16 09/27/19  Anice Paganini, NP    Family History Family History  Problem Relation Age of Onset   Healthy Mother    Colon cancer Father 68       Passed away from colon CA    Social History Social History   Tobacco Use   Smoking status: Former    Packs/day: 0.50    Types: Cigarettes    Quit date: 11/14/2015    Years since quitting: 6.3   Smokeless tobacco: Never  Substance Use Topics   Alcohol use: No   Drug use: No     Allergies   Aspirin   Review of Systems Review of Systems Per HPI  Physical Exam Triage Vital Signs ED Triage Vitals  Enc Vitals Group     BP 03/24/22 1858 (!) 144/86     Pulse Rate 03/24/22 1858 61     Resp 03/24/22 1858 18  Temp 03/24/22 1858 98.3 F (36.8 C)     Temp src --      SpO2 03/24/22 1858 96 %     Weight --      Height --      Head Circumference --      Peak Flow --      Pain Score 03/24/22 1857 8     Pain Loc --      Pain Edu? --      Excl. in GC? --    No data found.  Updated Vital Signs BP (!) 144/86   Pulse 61   Temp 98.3 F (36.8 C)   Resp 18   LMP 03/24/2022   SpO2 96%   Visual Acuity Right Eye Distance:   Left Eye Distance:   Bilateral Distance:    Right Eye Near:   Left Eye Near:    Bilateral Near:     Physical Exam Vitals and nursing note reviewed.  Constitutional:      General: She is not in acute distress.    Appearance: Normal appearance.  HENT:     Head: Normocephalic.  Musculoskeletal:     Cervical back: Normal range of motion.     Thoracic back: Spasms and tenderness (tenderness to T10-T8- right) present. No swelling.  Normal range of motion.  Lymphadenopathy:     Cervical: No cervical adenopathy.  Skin:    General: Skin is warm and dry.  Neurological:     General: No focal deficit present.     Mental Status: She is alert and oriented to person, place, and time.  Psychiatric:        Mood and Affect: Mood normal.        Behavior: Behavior normal.      UC Treatments / Results  Labs (all labs ordered are listed, but only abnormal results are displayed) Labs Reviewed - No data to display  EKG   Radiology No results found.  Procedures Procedures (including critical care time)  Medications Ordered in UC Medications  ketorolac (TORADOL) 30 MG/ML injection 30 mg (30 mg Intramuscular Given 03/24/22 1928)  dexamethasone (DECADRON) injection 10 mg (10 mg Intramuscular Given 03/24/22 1927)    Initial Impression / Assessment and Plan / UC Course  I have reviewed the triage vital signs and the nursing notes.  Pertinent labs & imaging results that were available during my care of the patient were reviewed by me and considered in my medical decision making (see chart for details).  Patient presents for complaints of right-sided thoracic back pain after she was moving her grandmother.  There are no red flag symptoms noted on exam.  Symptoms are consistent with thoracic myofascial strain.  Patient was given Toradol 30 mg IM and Decadron 10 mg IM in the clinic this evening.  Patient was started on methocarbamol for her symptoms.  She will continue ibuprofen that she currently has.  Supportive care recommendations were provided to the patient to include the use of ice or heat, and gentle stretching exercises.  Patient was advised to remain as active as possible.  Patient verbalizes understanding.  All questions were answered.  Patient is stable for discharge. Final Clinical Impressions(s) / UC Diagnoses   Final diagnoses:  Thoracic myofascial strain, initial encounter     Discharge Instructions       Take medication as prescribed. Try to remain as active as possible. Apply ice or heat. Apply ice for pain or swelling, heat for spasm or stiffness. Apply  for 20 minutes, remove for 1 hour then repeat. Gentle stretching exercises while symptoms persist. Go to the emergency department if you develop loss of bowel/bladder function, numbness or tingling in your hand or arms, or weakness in your legs or upper extremities. Follow up with your PCP if symptoms do not improve.      ED Prescriptions     Medication Sig Dispense Auth. Provider   methocarbamol (ROBAXIN) 500 MG tablet Take 1 tablet (500 mg total) by mouth 2 (two) times daily. 20 tablet Serenity Batley-Warren, Alda Lea, NP      PDMP not reviewed this encounter.   Tish Men, NP 03/24/22 2005

## 2022-06-28 DIAGNOSIS — F329 Major depressive disorder, single episode, unspecified: Secondary | ICD-10-CM | POA: Diagnosis not present

## 2022-06-28 DIAGNOSIS — R69 Illness, unspecified: Secondary | ICD-10-CM | POA: Diagnosis not present

## 2022-07-05 DIAGNOSIS — R69 Illness, unspecified: Secondary | ICD-10-CM | POA: Diagnosis not present

## 2022-07-05 DIAGNOSIS — F329 Major depressive disorder, single episode, unspecified: Secondary | ICD-10-CM | POA: Diagnosis not present

## 2022-07-07 DIAGNOSIS — F329 Major depressive disorder, single episode, unspecified: Secondary | ICD-10-CM | POA: Diagnosis not present

## 2022-07-07 DIAGNOSIS — R69 Illness, unspecified: Secondary | ICD-10-CM | POA: Diagnosis not present

## 2022-09-08 ENCOUNTER — Other Ambulatory Visit (HOSPITAL_COMMUNITY): Payer: Self-pay | Admitting: Family Medicine

## 2022-09-08 DIAGNOSIS — Z1231 Encounter for screening mammogram for malignant neoplasm of breast: Secondary | ICD-10-CM

## 2022-09-29 DIAGNOSIS — S83105A Unspecified dislocation of left knee, initial encounter: Secondary | ICD-10-CM | POA: Diagnosis not present

## 2022-09-29 DIAGNOSIS — Z79899 Other long term (current) drug therapy: Secondary | ICD-10-CM | POA: Diagnosis not present

## 2022-09-29 DIAGNOSIS — Z886 Allergy status to analgesic agent status: Secondary | ICD-10-CM | POA: Diagnosis not present

## 2022-09-29 DIAGNOSIS — M25562 Pain in left knee: Secondary | ICD-10-CM | POA: Diagnosis not present

## 2022-09-29 DIAGNOSIS — I10 Essential (primary) hypertension: Secondary | ICD-10-CM | POA: Diagnosis not present

## 2022-09-29 DIAGNOSIS — Z9851 Tubal ligation status: Secondary | ICD-10-CM | POA: Diagnosis not present

## 2022-09-29 DIAGNOSIS — W19XXXA Unspecified fall, initial encounter: Secondary | ICD-10-CM | POA: Diagnosis not present

## 2022-09-30 DIAGNOSIS — Z7182 Exercise counseling: Secondary | ICD-10-CM | POA: Diagnosis not present

## 2022-09-30 DIAGNOSIS — G43909 Migraine, unspecified, not intractable, without status migrainosus: Secondary | ICD-10-CM | POA: Diagnosis not present

## 2022-09-30 DIAGNOSIS — R252 Cramp and spasm: Secondary | ICD-10-CM | POA: Diagnosis not present

## 2022-09-30 DIAGNOSIS — Z713 Dietary counseling and surveillance: Secondary | ICD-10-CM | POA: Diagnosis not present

## 2022-09-30 DIAGNOSIS — Z800 Family history of malignant neoplasm of digestive organs: Secondary | ICD-10-CM | POA: Diagnosis not present

## 2022-09-30 DIAGNOSIS — I10 Essential (primary) hypertension: Secondary | ICD-10-CM | POA: Diagnosis not present

## 2022-09-30 DIAGNOSIS — E785 Hyperlipidemia, unspecified: Secondary | ICD-10-CM | POA: Diagnosis not present

## 2022-09-30 DIAGNOSIS — M25562 Pain in left knee: Secondary | ICD-10-CM | POA: Diagnosis not present

## 2022-09-30 DIAGNOSIS — E559 Vitamin D deficiency, unspecified: Secondary | ICD-10-CM | POA: Diagnosis not present

## 2022-10-10 ENCOUNTER — Encounter: Payer: Self-pay | Admitting: *Deleted

## 2023-04-02 ENCOUNTER — Encounter: Payer: Self-pay | Admitting: *Deleted
